# Patient Record
Sex: Female | Born: 1985 | Race: White | Hispanic: No | Marital: Married | Smoking: Former smoker
Health system: Southern US, Community
[De-identification: ages and names within clinical notes are randomized; demographics above are authoritative.]

## PROBLEM LIST (undated history)

## (undated) ENCOUNTER — Inpatient Hospital Stay (HOSPITAL_COMMUNITY): Payer: Self-pay

## (undated) DIAGNOSIS — R51 Headache: Secondary | ICD-10-CM

## (undated) DIAGNOSIS — D649 Anemia, unspecified: Secondary | ICD-10-CM

## (undated) DIAGNOSIS — N809 Endometriosis, unspecified: Secondary | ICD-10-CM

## (undated) DIAGNOSIS — R519 Headache, unspecified: Secondary | ICD-10-CM

## (undated) DIAGNOSIS — R87629 Unspecified abnormal cytological findings in specimens from vagina: Secondary | ICD-10-CM

## (undated) DIAGNOSIS — G932 Benign intracranial hypertension: Secondary | ICD-10-CM

## (undated) DIAGNOSIS — Z8759 Personal history of other complications of pregnancy, childbirth and the puerperium: Secondary | ICD-10-CM

## (undated) DIAGNOSIS — I10 Essential (primary) hypertension: Secondary | ICD-10-CM

## (undated) HISTORY — DX: Headache, unspecified: R51.9

## (undated) HISTORY — DX: Endometriosis, unspecified: N80.9

## (undated) HISTORY — DX: Essential (primary) hypertension: I10

## (undated) HISTORY — PX: COLPOSCOPY: SHX161

## (undated) HISTORY — DX: Unspecified abnormal cytological findings in specimens from vagina: R87.629

## (undated) HISTORY — DX: Headache: R51

---
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
## 2014-08-07 DIAGNOSIS — G932 Benign intracranial hypertension: Secondary | ICD-10-CM

## 2014-08-07 HISTORY — DX: Benign intracranial hypertension: G93.2

## 2014-08-25 ENCOUNTER — Telehealth: Payer: Self-pay | Admitting: Neurology

## 2014-08-25 NOTE — Telephone Encounter (Signed)
Pt moved appt from 02/16 to 08/28/14. Dr. Epifania Gorerr/referring provider was notified.

## 2014-08-27 ENCOUNTER — Ambulatory Visit (INDEPENDENT_AMBULATORY_CARE_PROVIDER_SITE_OTHER): Payer: BLUE CROSS/BLUE SHIELD | Admitting: Neurology

## 2014-08-27 ENCOUNTER — Encounter: Payer: Self-pay | Admitting: Neurology

## 2014-08-27 VITALS — BP 130/74 | HR 78 | Temp 98.6°F | Resp 16 | Ht 67.0 in | Wt 267.2 lb

## 2014-08-27 DIAGNOSIS — H4710 Unspecified papilledema: Secondary | ICD-10-CM

## 2014-08-27 NOTE — Progress Notes (Signed)
NEUROLOGY CONSULTATION NOTE  Lindsey Walker MRN: [id number] DOB: 05/17/1986  Referring provider: Dr. Virginia Rochester Primary care provider: Dr. Lenise Arena  Reason for consult:  Papilledema  HISTORY OF PRESENT ILLNESS: Lindsey Walker is a 29 year old right-handed woman with who presents for evaluation of papilledema.  She is accompanied by her husband and mother who provide some history.  Records reviewed.  About 2 weeks ago, she had a routine eye exam with her optometrist.  Bilateral optic nerve swelling was noted on exam.  She reports that she has been having some vision problems over the past month.  She describes this as mostly seeing spots, which comes and goes.  Sometimes it is difficult to focus.  When she feels agitated, she will see specks in her vision as well.  She also reports a dull bi-frontal headache, described as a dull pressure and about 3-4/10 intensity.  It is constant.  It is not positional and not worse whether she is laying down or standing.  She does wake up with the headache but also notes it can get worse in the evening.  She denies visual obscurations.  She sometimes notes pulsatile tinnitus.  She denies feeling of going to pass out.  She denies numbness and tingling sensation.  She denies eye pain or difficulty ambulating.  She denies neck pain.  PAST MEDICAL HISTORY: Past Medical History  Diagnosis Date  . Headache   . Endometriosis     PAST SURGICAL HISTORY: No past surgical history on file.  MEDICATIONS: No current outpatient prescriptions on file prior to visit.   No current facility-administered medications on file prior to visit.    ALLERGIES: Allergies  Allergen Reactions  . Sulfa Antibiotics     FAMILY HISTORY: Family History  Problem Relation Age of Onset  . Adopted: Yes  . Thyroid disease Mother   . Parkinson's disease Maternal Grandfather   . Thyroid disease Maternal Grandmother   . Hypertension Maternal Grandmother   . Cancer Maternal  Grandmother     bladder    SOCIAL HISTORY: History   Social History  . Marital Status: Unknown    Spouse Name: N/A    Number of Children: N/A  . Years of Education: N/A   Occupational History  . Not on file.   Social History Main Topics  . Smoking status: Former Games developer  . Smokeless tobacco: Former Neurosurgeon  . Alcohol Use: 0.0 oz/week    0 Not specified per week     Comment: socially  . Drug Use: No  . Sexual Activity:    Partners: Male   Other Topics Concern  . Not on file   Social History Narrative  . No narrative on file    REVIEW OF SYSTEMS: Constitutional: No fevers, chills, or sweats, no generalized fatigue, change in appetite Eyes: as above Ear, nose and throat: No hearing loss, ear pain, nasal congestion, sore throat Cardiovascular: No chest pain, palpitations Respiratory:  No shortness of breath at rest or with exertion, wheezes GastrointestinaI: No nausea, vomiting, diarrhea, abdominal pain, fecal incontinence Genitourinary:  No dysuria, urinary retention or frequency Musculoskeletal:  No neck pain, back pain Integumentary: No rash, pruritus, skin lesions Neurological: as above Psychiatric: No depression, insomnia, anxiety Endocrine: No palpitations, fatigue, diaphoresis, mood swings, change in appetite, change in weight, increased thirst Hematologic/Lymphatic:  No anemia, purpura, petechiae. Allergic/Immunologic: no itchy/runny eyes, nasal congestion, recent allergic reactions, rashes  PHYSICAL EXAM: Filed Vitals:   08/27/14 0819  BP: 130/74  Pulse: 78  Temp: 98.6 F (37 C)  Resp: 16   General: No acute distress Head:  Normocephalic/atraumatic Eyes:  fundi unremarkable, without vessel changes, exudates, hemorrhages or papilledema. Neck: supple, no paraspinal tenderness, full range of motion Back: No paraspinal tenderness Heart: regular rate and rhythm Lungs: Clear to auscultation bilaterally. Vascular: No carotid bruits. Neurological  Exam: Mental status: alert and oriented to person, place, and time, recent and remote memory intact, fund of knowledge intact, attention and concentration intact, speech fluent and not dysarthric, language intact. Cranial nerves: CN I: not tested CN II: pupils equal, round and reactive to light, visual fields intact, fundi reveal some blurring of the disc margins bilaterally.  Visual acuity grossly 20/20 bilaterally with corrective lenses. CN III, IV, VI:  full range of motion, no nystagmus, no ptosis CN V: facial sensation intact CN VII: upper and lower face symmetric CN VIII: hearing intact CN IX, X: gag intact, uvula midline CN XI: sternocleidomastoid and trapezius muscles intact CN XII: tongue midline Bulk & Tone: normal, no fasciculations. Motor:  5/5 throughout Sensation:  Temperature and vibration intact Deep Tendon Reflexes:  2+ throughout, toes downgoing Finger to nose testing:  No dysmetria Heel to shin:  No dysmetria Gait:  Normal station and stride.  Able to turn and walk in tandem. Romberg negative.  IMPRESSION: Papilledema.  PLAN: 1.  Will get MRI of brain with and without contrast to rule out structural etiology. 2.  Pending results, may get LP to evaluate CSF, including opening pressure 3.  Follow up after testing.  Thank you for allowing me to take part in the care of this patient.  Shon MilletAdam Jaffe, DO  CC:  Daneen SchickKimberly Orr, OD  Joycelyn RuaStephen Meyers, MD

## 2014-08-27 NOTE — Patient Instructions (Addendum)
We will look into causes of the optic nerve swelling. 1.  First, we will get an MRI of the brain to look for any noticeable cause for these findings.  [facility]  09/11/14 2:45pm 2.  Afterwards, pending results of MRI, we would likely get a lumbar puncture. 3.  I want to see you afterwards.

## 2014-09-11 ENCOUNTER — Ambulatory Visit (HOSPITAL_COMMUNITY)
Admission: RE | Admit: 2014-09-11 | Discharge: 2014-09-11 | Disposition: A | Discharge status: Home / Self Care | Payer: BLUE CROSS/BLUE SHIELD | Source: Ambulatory Visit | Attending: Neurology | Admitting: Neurology

## 2014-09-11 DIAGNOSIS — H4710 Unspecified papilledema: Secondary | ICD-10-CM | POA: Diagnosis not present

## 2014-09-11 DIAGNOSIS — J341 Cyst and mucocele of nose and nasal sinus: Secondary | ICD-10-CM | POA: Insufficient documentation

## 2014-09-11 DIAGNOSIS — R51 Headache: Secondary | ICD-10-CM | POA: Diagnosis present

## 2014-09-11 DIAGNOSIS — H9319 Tinnitus, unspecified ear: Secondary | ICD-10-CM | POA: Insufficient documentation

## 2014-09-11 MED ORDER — GADOBENATE DIMEGLUMINE 529 MG/ML IV SOLN: 20.0000 mL | Freq: Once | INTRAVENOUS | Status: AC | PRN

## 2014-09-14 ENCOUNTER — Other Ambulatory Visit: Payer: Self-pay | Admitting: *Deleted

## 2014-09-14 ENCOUNTER — Telehealth: Payer: Self-pay | Admitting: *Deleted

## 2014-09-14 DIAGNOSIS — H4710 Unspecified papilledema: Secondary | ICD-10-CM

## 2014-09-14 NOTE — Telephone Encounter (Signed)
-----   Message from Cira ServantAdam Robert Jaffe, DO sent at 09/14/2014  6:36 AM EST ----- MRI of the brain does show subtle changes of the optic nerves which correlate with papilledema.  The brain looks okay.  Based on these findings, I would proceed with a lumbar puncture (as we discussed), checking opening pressure, protein and glucose.  She should follow up soon after.  ----- Message -----    From: Rad Results In Interface    Sent: 09/11/2014   5:47 PM      To: Cira ServantAdam Robert Jaffe, DO

## 2014-09-14 NOTE — Telephone Encounter (Signed)
Patient is aware of MRI results  LP will be set up with [place] I have ask her to call me back when she gets a day and time so I can schedule an appt with Dr Everlena CooperJaffe

## 2014-09-14 NOTE — Telephone Encounter (Signed)
patient is aware   MRI of the brain does show subtle changes of the optic nerves which correlate with papilledema.  The brain looks okay.  Based on these findings, I would proceed with a lumbar puncture (as we discussed), checking opening pressure, , [facility] has been notified  And orders [facility]

## 2014-09-14 NOTE — Telephone Encounter (Signed)
Left message for patient to return call to office  regarding her labs

## 2014-09-17 ENCOUNTER — Other Ambulatory Visit: Payer: Self-pay | Admitting: Neurology

## 2014-09-17 ENCOUNTER — Ambulatory Visit
Admission: RE | Admit: 2014-09-17 | Discharge: 2014-09-17 | Disposition: A | Discharge status: Home / Self Care | Payer: BLUE CROSS/BLUE SHIELD | Source: Ambulatory Visit | Attending: Neurology | Admitting: Neurology

## 2014-09-17 DIAGNOSIS — H4710 Unspecified papilledema: Secondary | ICD-10-CM

## 2014-09-17 LAB — GLUCOSE, CSF: Glucose, CSF: 62 mg/dL (ref 43–76)

## 2014-09-17 LAB — PROTEIN, CSF: Total Protein, CSF: 21 mg/dL (ref 15–45)

## 2014-09-17 MED ORDER — DIAZEPAM 5 MG PO TABS
5.0000 mg | ORAL_TABLET | Freq: Once | ORAL | Status: AC
  Administered 2014-09-17: 5 mg via ORAL

## 2014-09-17 NOTE — Discharge Instructions (Signed)

## 2014-09-21 ENCOUNTER — Ambulatory Visit (INDEPENDENT_AMBULATORY_CARE_PROVIDER_SITE_OTHER): Payer: BLUE CROSS/BLUE SHIELD | Admitting: Neurology

## 2014-09-21 ENCOUNTER — Encounter: Payer: Self-pay | Admitting: Neurology

## 2014-09-21 VITALS — BP 120/78 | HR 96 | Resp 16 | Ht 67.0 in | Wt 264.0 lb

## 2014-09-21 DIAGNOSIS — H4710 Unspecified papilledema: Secondary | ICD-10-CM

## 2014-09-21 DIAGNOSIS — G932 Benign intracranial hypertension: Secondary | ICD-10-CM

## 2014-09-21 MED ORDER — TOPIRAMATE 50 MG PO TABS: ORAL_TABLET | ORAL | Status: DC

## 2014-09-21 NOTE — Progress Notes (Signed)
NEUROLOGY FOLLOW UP OFFICE NOTE  Lindsey ShellingJessica A Walker [id number]  HISTORY OF PRESENT ILLNESS: [name] is a 29 year old right-handed woman with who follows up for papilledema.  She is accompanied by her husband and mother who provide some history.  MRI and LP results reviewed.  She is accompanied by her husband who provides some history.  UPDATE: MRI of the brain with and without contrast performed on 09/11/14 showed prominent optic nerve sheathes bilaterally.  She underwent an LP on 09/17/14, which revealed an opening pressure of 32 cm H2O.  Closing pressure was 18 cm H2O.  CSF protein was 21 and glucose was 62.  She has occasional mild bi-frontal headaches and vision is a little blurred.  HISTORY: Recently, she had a routine eye exam with her optometrist.  Bilateral optic nerve swelling was noted on exam.  She reports that she has been having some vision problems over the past month.  She describes this as mostly seeing spots, which comes and goes.  Sometimes it is difficult to focus.  When she feels agitated, she will see specks in her vision as well.  She also reports a dull bi-frontal headache, described as a dull pressure and about 3-4/10 intensity.  It is constant.  It is not positional and not worse whether she is laying down or standing.  She does wake up with the headache but also notes it can get worse in the evening.  She denies visual obscurations.  She sometimes notes pulsatile tinnitus.  She denies feeling of going to pass out.  She denies numbness and tingling sensation.  She denies eye pain or difficulty ambulating.  She denies neck pain.  PAST MEDICAL HISTORY: Past Medical History  Diagnosis Date  . Headache   . Endometriosis     MEDICATIONS: Current Outpatient Prescriptions on File Prior to Visit  Medication Sig Dispense Refill  . ibuprofen (ADVIL,MOTRIN) 200 MG tablet Take 200 mg by mouth every 6 (six) hours as needed for headache.     No current  facility-administered medications on file prior to visit.    ALLERGIES: Allergies  Allergen Reactions  . Sulfa Antibiotics     FAMILY HISTORY: Family History  Problem Relation Age of Onset  . Adopted: Yes  . Thyroid disease Mother   . Parkinson's disease Maternal Grandfather   . Thyroid disease Maternal Grandmother   . Hypertension Maternal Grandmother   . Cancer Maternal Grandmother     bladder    SOCIAL HISTORY: History   Social History  . Marital Status: Single    Spouse Name: N/A  . Number of Children: N/A  . Years of Education: N/A   Occupational History  . Not on file.   Social History Main Topics  . Smoking status: Former Games developermoker  . Smokeless tobacco: Former NeurosurgeonUser  . Alcohol Use: 0.0 oz/week    0 Standard drinks or equivalent per week     Comment: socially  . Drug Use: No  . Sexual Activity:    Partners: Male   Other Topics Concern  . Not on file   Social History Narrative    REVIEW OF SYSTEMS: Constitutional: No fevers, chills, or sweats, no generalized fatigue, change in appetite Eyes: No visual changes, double vision, eye pain Ear, nose and throat: No hearing loss, ear pain, nasal congestion, sore throat Cardiovascular: No chest pain, palpitations Respiratory:  No shortness of breath at rest or with exertion, wheezes GastrointestinaI: No nausea, vomiting, diarrhea, abdominal pain, fecal incontinence Genitourinary:  No dysuria, urinary retention or frequency Musculoskeletal:  No neck pain, back pain Integumentary: No rash, pruritus, skin lesions Neurological: as above Psychiatric: No depression, insomnia, anxiety Endocrine: No palpitations, fatigue, diaphoresis, mood swings, change in appetite, change in weight, increased thirst Hematologic/Lymphatic:  No anemia, purpura, petechiae. Allergic/Immunologic: no itchy/runny eyes, nasal congestion, recent allergic reactions, rashes  PHYSICAL EXAM: Filed Vitals:   09/21/14 1212  BP: 120/78  Pulse:  96  Resp: 16   General: No acute distress Head:  Normocephalic/atraumatic Eyes:  Fundi not able to be visualized on inspection Neck: supple, no paraspinal tenderness, full range of motion Heart:  Regular rate and rhythm Lungs:  Clear to auscultation bilaterally Back: No paraspinal tenderness Neurological Exam: alert and oriented to person, place, and time. Attention span and concentration intact, recent and remote memory intact, fund of knowledge intact.  Speech fluent and not dysarthric, language intact.  CN II-XII intact. Bulk and tone normal, muscle strength 5/5 throughout.  Sensation to light touch, temperature and vibration intact.  Deep tendon reflexes 2+ throughout.  Finger to nose testing intact.  Gait normal.  IMPRESSION: Idiopathic intracranial hypertension Morbid obesity  PLAN: 1.  I would ideally like to start acetazolamide.  Although it is not an absolute contraindication, I am hesitant about starting this medication due to her sulfa allergy, especially since it was anaphylaxis.  Instead, we will try topiramate and titrate up to goal of  twice daily.  It if does not appear to be effective, we can discuss switching to acetazolamide.  In meantime, I recommended starting folic acid  daily. 2.  I would like her to have a formal ophthalmologic evaluation again in 3 months with follow up with me soon afterwards. 3.  We will also check MRV of head to rule out venous sinus thrombosis. 4.  Weight loss  Shon Millet, DO  CC: Joycelyn Rua, MD

## 2014-09-21 NOTE — Patient Instructions (Addendum)
1.  We will start topiramate (Topamax) 50mg  tablets.  We will increase the dose as follows to goal of 100mg  twice daily:      Morning Evening Week 1:   0.5 tab  0.5 tab Week 2:    1 tab  1 tab Week 3:   1.5 tabs 1.5 tabs Week 4 and thereafter 2 tabs  2 tabs.  Possible side effects include: impaired thinking, sedation, paresthesias (numbness and tingling) and weight loss.  It may cause dehydration and there is a small risk for kidney stones, so make sure to stay hydrated with water during the day.  There is also a very small risk for glaucoma, so if you notice any change in your vision while taking this medication, see an ophthalmologist.  There is also a very small risk of possible suicidal ideation, as it the case with all antiepileptic medications.  Pregnancy Guidelines 1. If any medication changes are to be made, they should be completed several months before conception. Lamictal (lamotrigine) and Trileptal (oxcarbazepine) levels, in particular, need to carefully monitored (at least once a month) during pregnancy as drug levels for these two medications tend to decrease by at least 30% during pregnancy. 2. The risk of fetal malformation is increased in women taking seizure medications: However, the risk is lower in Topamax, when compared to older antiepileptic medications.  The risk is approximately 3% compared to 1-2% in the general population. 3. Women with epilepsy planning a pregnancy should take 5 mg/day of folic acid in the preconception period and throughout pregnancy. 4. Vitamin K (20 mg/day) should be used in the last month of pregnancy in women on enzyme-inducing seizure medications (such as Topamax).  Infants should receive 1 mg of vitamin K intramuscularly at birth, to prevent hemorrhagic disease of the newborn. 5. Overbreathing (hyperventilation), sleep deprivation, pain, and emotional stress increase the risk of seizures during labor.  Consider epidural anesthesia early in the  labor. 6. All currently available seizure medications can be taken while breast feeding.  2.  We will refer you to an ophthalmologist for evaluation of papilledema.  I would like this scheduled in 2 to 3 months.  Follow up with me in 3 months following eye exam.  3.  We will check MRV of head.   YOU HAVE BEEN SCHEDULED AT [facility]  Wed. 12/09/2014 @ 9:30 am with Dr. Sinda DuBradley Bowen  [address] [place], [place] [id number] [phone]

## 2014-09-25 ENCOUNTER — Ambulatory Visit: Payer: Self-pay | Admitting: Neurology

## 2014-10-01 ENCOUNTER — Ambulatory Visit (HOSPITAL_COMMUNITY)
Admission: RE | Admit: 2014-10-01 | Discharge: 2014-10-01 | Disposition: A | Discharge status: Home / Self Care | Payer: BLUE CROSS/BLUE SHIELD | Source: Ambulatory Visit | Attending: Neurology | Admitting: Neurology

## 2014-10-01 DIAGNOSIS — G932 Benign intracranial hypertension: Secondary | ICD-10-CM | POA: Insufficient documentation

## 2014-10-01 DIAGNOSIS — R51 Headache: Secondary | ICD-10-CM | POA: Diagnosis present

## 2014-10-01 DIAGNOSIS — H539 Unspecified visual disturbance: Secondary | ICD-10-CM | POA: Diagnosis not present

## 2014-10-02 ENCOUNTER — Telehealth: Payer: Self-pay | Admitting: *Deleted

## 2014-10-02 NOTE — Telephone Encounter (Signed)
-----   Message from Cira ServantAdam Robert Jaffe, DO sent at 10/01/2014  9:55 PM EST ----- MRV of the head is unremarkable ----- Message -----    From: Rad Results In Interface    Sent: 10/01/2014   7:17 PM      To: Cira ServantAdam Robert Jaffe, DO

## 2014-10-02 NOTE — Telephone Encounter (Signed)
Patient is aware of normal MRV

## 2014-10-17 ENCOUNTER — Other Ambulatory Visit: Payer: Self-pay | Admitting: Neurology

## 2014-10-27 ENCOUNTER — Telehealth: Payer: Self-pay | Admitting: Neurology

## 2014-10-27 NOTE — Telephone Encounter (Signed)
Pt called wanting to speak to a nurse regarding her script for TOPAMAX. Pt is experiencing "white flashes" on her vision. She states that it started last week. Please call her back [phone] and/or [phone]

## 2014-10-27 NOTE — Telephone Encounter (Signed)
Patient states she has started having white flashing in the eyes  Like a camera  About 2 times a day . She is at her full strength of  TOPAMAX 200mg  daily and has been for 2 weeks this has been going on for a few days. I have advised her to [facility] and see if her appt can be moved up sooner it is in May  She is asking if this could be a reaction to the TOPAMAX ? Please advies

## 2014-10-27 NOTE — Telephone Encounter (Signed)
Probably not, but she I agree that she have another formal eye exam.

## 2014-10-27 NOTE — Telephone Encounter (Signed)
Patient will try to get appt moved up for eye exam

## 2014-11-03 ENCOUNTER — Telehealth: Payer: Self-pay | Admitting: Neurology

## 2014-11-03 ENCOUNTER — Other Ambulatory Visit: Payer: Self-pay | Admitting: *Deleted

## 2014-11-03 ENCOUNTER — Other Ambulatory Visit: Payer: Self-pay | Admitting: Neurology

## 2014-11-03 DIAGNOSIS — G932 Benign intracranial hypertension: Secondary | ICD-10-CM

## 2014-11-03 NOTE — Telephone Encounter (Signed)
I spoke with patient she states she has had a headache now for going on 5 days and the Topamax 200mg  daily  is not helping . She did have her formal eye testing done yesterday and states it did not go well they found her to have swelling of the optic nerve . I have requested this office note and testing be sent to us asap . She is asking for FMLA maybe a bit of time off would help she says. Please advise

## 2014-11-03 NOTE — Telephone Encounter (Signed)
Pt states that she has had a headache for the last five days. She went to the eye Dr  yesterday and states that appt did not go well please call patient at [phone] and if you dont get her on that number call [phone]

## 2014-11-03 NOTE — Telephone Encounter (Signed)
I would start Lasix.  First, we need to check a BMP.  Then after reviewing the K level, would start Lasix 20mg  daily along with potassium chloride 20 mEq daily.  I would check another BMP in 2 weeks after that.    If she has had any improvement in headaches on the topamax at all, then I would continue it for now.  If there has been absolutely no improvement in headache overall, then she can take 100mg  daily for a week and then stop

## 2014-11-03 NOTE — Telephone Encounter (Signed)
Patient states that headaches have gotten better until about 5 days ago . She will stay on Topamax 200 mg  BMP slip was taken to front office she will pick up have labs drawn fasting and we will let her know when to start the Lasix .

## 2014-11-04 LAB — BASIC METABOLIC PANEL
BUN: 14 mg/dL (ref 6–23)
CALCIUM: 9.2 mg/dL (ref 8.4–10.5)
CHLORIDE: 109 meq/L (ref 96–112)
CO2: 21 mEq/L (ref 19–32)
CREATININE: 0.68 mg/dL (ref 0.50–1.10)
Glucose, Bld: 83 mg/dL (ref 70–99)
Potassium: 4.5 mEq/L (ref 3.5–5.3)
SODIUM: 140 meq/L (ref 135–145)

## 2014-11-05 ENCOUNTER — Other Ambulatory Visit: Payer: Self-pay | Admitting: *Deleted

## 2014-11-05 ENCOUNTER — Telehealth: Payer: Self-pay | Admitting: *Deleted

## 2014-11-05 DIAGNOSIS — G932 Benign intracranial hypertension: Secondary | ICD-10-CM

## 2014-11-05 MED ORDER — FUROSEMIDE 20 MG PO TABS: 20.0000 mg | ORAL_TABLET | Freq: Every day | ORAL | Status: DC

## 2014-11-05 MED ORDER — POTASSIUM CHLORIDE ER 10 MEQ PO TBCR: 20.0000 meq | EXTENDED_RELEASE_TABLET | Freq: Every day | ORAL | Status: DC

## 2014-11-05 NOTE — Telephone Encounter (Signed)
-----   Message from Drema DallasAdam R Jaffe, DO sent at 11/05/2014  6:53 AM EDT ----- Potassium looks okay.  Start Lasix 20mg  daily along with potassium chloride 20 mEq daily. I would check another BMP in 2 weeks. ----- Message -----    From: Lab in [facility] Interface    Sent: 11/04/2014  10:28 PM      To: Drema DallasAdam R Jaffe, DO

## 2014-11-05 NOTE — Telephone Encounter (Signed)
Patient is aware that Potassium looks okay. Start Lasix 20mg  daily along with potassium chloride 20 mEq daily. She  check another BMP in 2 weeks.I will mail lab slip to her..Marland Kitchen

## 2014-11-12 ENCOUNTER — Encounter: Payer: Self-pay | Admitting: Neurology

## 2014-11-12 ENCOUNTER — Ambulatory Visit (INDEPENDENT_AMBULATORY_CARE_PROVIDER_SITE_OTHER): Payer: BLUE CROSS/BLUE SHIELD | Admitting: Neurology

## 2014-11-12 VITALS — BP 118/68 | HR 88 | Resp 20 | Ht 67.0 in | Wt 249.5 lb

## 2014-11-12 DIAGNOSIS — Z882 Allergy status to sulfonamides status: Secondary | ICD-10-CM | POA: Diagnosis not present

## 2014-11-12 DIAGNOSIS — G932 Benign intracranial hypertension: Secondary | ICD-10-CM

## 2014-11-12 MED ORDER — TOPIRAMATE 100 MG PO TABS: 200.0000 mg | ORAL_TABLET | Freq: Every day | ORAL | Status: DC

## 2014-11-12 MED ORDER — FUROSEMIDE 20 MG PO TABS: 20.0000 mg | ORAL_TABLET | Freq: Two times a day (BID) | ORAL | Status: DC

## 2014-11-12 NOTE — Patient Instructions (Signed)
1.  Increase Lasix to 20mg  twice daily.  Will repeat BMP lab in one week. 2.  Take Topiramate 200mg  at bedtime 3.  Refer to allergist to assess sulfa allergy 4.  Send FMLA papers 5.  Follow up with the eye doctor later this month and then with me in one month.

## 2014-11-12 NOTE — Progress Notes (Signed)
NEUROLOGY FOLLOW UP OFFICE NOTE  LATESHA CHESNEY [id number]  HISTORY OF PRESENT ILLNESS: Lindsey Walker is a 29 year old right-handed woman with who follows up for papilledema.  She is accompanied by her husband and mother who provide some history.  Records, labs and MRV of head reviewed.  She is accompanied by her husband who provides some history.  UPDATE: MRV of head was negative for sinus thrombosis.  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  Because she has a sulfa allergy, she was started on topiramate, which was titrated to  twice daily.  It helped the headaches overall, but she then developed worsening headache and vision problems.  Last week, she had a follow up eye exam, which showed slight worsening of papilledema.  K was checked, which was 4.5.  She was started on Lasix  daily, in addition to topiramate.  She says that the Lasix is causing side effects.  It makes it difficult for her to concentrate.  She also has had headache for 15 days.  It is difficult for her to work.  She now says that her mother believes her allergic reaction to sulfa as a young child caused a severe rash and not anaphylaxis.  She has been losing weight.  She has lost 15 lbs since last visit in February.  HISTORY: Earlier this year, she had a routine eye exam with her optometrist.  Bilateral optic nerve swelling was noted on exam.  She reports that she has been having some vision problems over the past month.  She describes this as mostly seeing spots, which comes and goes.  Sometimes it is difficult to focus.  When she feels agitated, she will see specks in her vision as well.  She also reports a dull bi-frontal headache, described as a dull pressure and about 3-4/10 intensity.  It is constant.  It is not positional and not worse whether she is laying down or standing.  She does wake up with the headache but  also notes it can get worse in the evening.  She denies visual obscurations.  She sometimes notes pulsatile tinnitus.  She denies feeling of going to pass out.  She denies numbness and tingling sensation.  She denies eye pain or difficulty ambulating.  She denies neck pain.    MRI of the brain with and without contrast performed on 09/11/14 showed prominent optic nerve sheathes bilaterally.  She underwent an LP on 09/17/14, which revealed an opening pressure of 32 cm H2O.  Closing pressure was 18 cm H2O.  CSF protein was 21 and glucose was 62.  She has a sulfa allergy with anaphylactic reaction, so she cannot take acetazolamide.   She works as a Data processing manager for [employer].  PAST MEDICAL HISTORY: Past Medical History  Diagnosis Date  . Headache   . Endometriosis     MEDICATIONS: Current Outpatient Prescriptions on File Prior to Visit  Medication Sig Dispense Refill  . ibuprofen (ADVIL,MOTRIN) 200 MG tablet Take 200 mg by mouth every 6 (six) hours as needed for headache.    . potassium chloride (K-DUR) 10 MEQ tablet Take 2 tablets (20 mEq total) by mouth daily. 60 tablet 1   No current facility-administered medications on file prior to visit.    ALLERGIES: Allergies  Allergen Reactions  . Sulfa Antibiotics     FAMILY HISTORY: Family History  Problem Relation Age of Onset  .  Adopted: Yes  . Thyroid disease Mother   . Parkinson's disease Maternal Grandfather   . Thyroid disease Maternal Grandmother   . Hypertension Maternal Grandmother   . Cancer Maternal Grandmother     bladder    SOCIAL HISTORY: History   Social History  . Marital Status: Single    Spouse Name: N/A  . Number of Children: N/A  . Years of Education: N/A   Occupational History  . Not on file.   Social History Main Topics  . Smoking status: Former Games developer  . Smokeless tobacco: Never Used  . Alcohol Use: 0.0 oz/week    0 Standard drinks or equivalent per week     Comment: socially  . Drug Use: No    . Sexual Activity:    Partners: Male   Other Topics Concern  . Not on file   Social History Narrative    REVIEW OF SYSTEMS: Constitutional: No fevers, chills, or sweats, no generalized fatigue, change in appetite Eyes: No visual changes, double vision, eye pain Ear, nose and throat: No hearing loss, ear pain, nasal congestion, sore throat Cardiovascular: No chest pain, palpitations Respiratory:  No shortness of breath at rest or with exertion, wheezes GastrointestinaI: No nausea, vomiting, diarrhea, abdominal pain, fecal incontinence Genitourinary:  No dysuria, urinary retention or frequency Musculoskeletal:  No neck pain, back pain Integumentary: No rash, pruritus, skin lesions Neurological: as above Psychiatric: No depression, insomnia, anxiety Endocrine: No palpitations, fatigue, diaphoresis, mood swings, change in appetite, change in weight, increased thirst Hematologic/Lymphatic:  No anemia, purpura, petechiae. Allergic/Immunologic: no itchy/runny eyes, nasal congestion, recent allergic reactions, rashes  PHYSICAL EXAM: Filed Vitals:   11/12/14 0749  BP: 118/68  Pulse: 88  Resp: 20   General: No acute distress, but in tears when discussing her discomfort Head:  Normocephalic/atraumatic Eyes:  Unable to visualize fundi on inspection Neck: supple, no paraspinal tenderness, full range of motion Heart:  Regular rate and rhythm Lungs:  Clear to auscultation bilaterally Back: No paraspinal tenderness Neurological Exam: alert and oriented to person, place, and time. Attention span and concentration intact, recent and remote memory intact, fund of knowledge intact.  Speech fluent and not dysarthric, language intact.  CN II-XII intact.  Bulk and tone normal, muscle strength 5/5 throughout.  Sensation to light touch, temperature and vibration intact.  Deep tendon reflexes 2+ throughout, toes downgoing.  Finger to nose and heel to shin testing intact.  Gait normal, Romberg  negative.  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  Due to the confusion and lack of clarity regarding her reaction to sulfa, we will refer her to an allergist in case acetazolamide may still be a possibility.  I don't mind starting it and monitoring for a rash, but I really don't want to start it if there is any chance that she had an anaphylactic reaction. 2.  We will increase Lasix to  twice daily.  Will recheck BMP (potassium) in one week. 3.  Continue topiramate, but may take  at bedtime (instead of  twice daily) 4.  Continue weight loss and low sodium diet. 5.  Asked that she send FMLA forms. 6.  She will need to have her eyes examined every month.  She is scheduled to see the ophthalmologist at the end of the month.  I will see her back in 4 weeks. 7.  If acetazolamide is not an option and she cannot remain on Lasix, we discussed other options such as  optic nerve sheathe fenestration or therapeutic tap (if the headaches particularly get severe)  Shon MilletAdam Mertis Mosher, DO  CC:  Joycelyn RuaStephen Meyers, MD

## 2014-11-13 ENCOUNTER — Encounter: Payer: Self-pay | Admitting: Neurology

## 2014-11-17 ENCOUNTER — Telehealth: Payer: Self-pay | Admitting: *Deleted

## 2014-11-17 ENCOUNTER — Telehealth: Payer: Self-pay | Admitting: Neurology

## 2014-11-17 ENCOUNTER — Other Ambulatory Visit: Payer: Self-pay | Admitting: *Deleted

## 2014-11-17 DIAGNOSIS — G932 Benign intracranial hypertension: Secondary | ICD-10-CM

## 2014-11-17 NOTE — Telephone Encounter (Signed)
Pt wants to know if we got the FMLA forms from her job last week please call [phone]

## 2014-11-17 NOTE — Telephone Encounter (Signed)
She can take the Topamax in the morning.  As for the Lasix, there are no other options as far as medication.  If it is really severe, we can schedule for a therapeutic high-volume spinal tap.

## 2014-11-17 NOTE — Telephone Encounter (Signed)
Just a high-volume tap with opening pressure.

## 2014-11-17 NOTE — Telephone Encounter (Signed)
I spoke with patient per Dr Everlena CooperJaffe it is ok to do Topamax in am and HS  He is recommending High Volume LP  She has agreed to this but will have to wait for 2 weeks as her spouse will be out of town. Please advise on what you what in the LP

## 2014-11-17 NOTE — Telephone Encounter (Signed)
Patient is aware that FMLA forms are have not arrived

## 2014-11-17 NOTE — Telephone Encounter (Signed)
Patient called asking can she take Topamax in am ? She states she noticed since she is taking Lasix her headaches are acting crazy. She went to gym and had one so bad she almost had to have someone come get her . Please advise

## 2014-11-18 ENCOUNTER — Other Ambulatory Visit: Payer: Self-pay | Admitting: Neurology

## 2014-11-19 DIAGNOSIS — Z0279 Encounter for issue of other medical certificate: Secondary | ICD-10-CM

## 2014-11-20 LAB — BASIC METABOLIC PANEL
BUN: 16 mg/dL (ref 6–23)
CO2: 25 mEq/L (ref 19–32)
CREATININE: 0.76 mg/dL (ref 0.50–1.10)
Calcium: 9.7 mg/dL (ref 8.4–10.5)
Chloride: 106 mEq/L (ref 96–112)
GLUCOSE: 88 mg/dL (ref 70–99)
POTASSIUM: 3.9 meq/L (ref 3.5–5.3)
Sodium: 141 mEq/L (ref 135–145)

## 2014-11-24 ENCOUNTER — Telehealth: Payer: Self-pay | Admitting: *Deleted

## 2014-11-24 ENCOUNTER — Telehealth: Payer: Self-pay | Admitting: Neurology

## 2014-11-24 NOTE — Telephone Encounter (Signed)
Pt wants to talk to susie about FMLA paper please call [phone]

## 2014-11-24 NOTE — Telephone Encounter (Signed)
Patient returning your call please call back first thing in the Am

## 2014-11-24 NOTE — Telephone Encounter (Signed)
I called and left a message for patient to return call ,

## 2014-11-25 ENCOUNTER — Telehealth: Payer: Self-pay | Admitting: *Deleted

## 2014-11-25 NOTE — Telephone Encounter (Signed)
Patient is aware that [facility]  After a 15 min discussion they have agreed to approve her FMLA for now .

## 2014-11-25 NOTE — Telephone Encounter (Signed)
I spoke with patient I explained to her there are just some areas on the FMLA that Dr Everlena CooperJaffe has already filled out to the best of his knowledge and cannot change . I will call the [name] today and talke to Ecorsearmen to try to help this move forward .

## 2014-12-02 ENCOUNTER — Ambulatory Visit
Admission: RE | Admit: 2014-12-02 | Discharge: 2014-12-02 | Disposition: A | Discharge status: Home / Self Care | Payer: BLUE CROSS/BLUE SHIELD | Source: Ambulatory Visit | Attending: Neurology | Admitting: Neurology

## 2014-12-02 DIAGNOSIS — G932 Benign intracranial hypertension: Secondary | ICD-10-CM

## 2014-12-02 MED ORDER — DIAZEPAM 5 MG PO TABS
5.0000 mg | ORAL_TABLET | Freq: Once | ORAL | Status: AC
  Administered 2014-12-02: 5 mg via ORAL

## 2014-12-02 NOTE — Discharge Instructions (Signed)

## 2014-12-04 ENCOUNTER — Other Ambulatory Visit: Payer: Self-pay | Admitting: *Deleted

## 2014-12-04 ENCOUNTER — Telehealth: Payer: Self-pay | Admitting: *Deleted

## 2014-12-04 ENCOUNTER — Other Ambulatory Visit: Payer: Self-pay | Admitting: Neurology

## 2014-12-04 DIAGNOSIS — G971 Other reaction to spinal and lumbar puncture: Secondary | ICD-10-CM

## 2014-12-04 DIAGNOSIS — G932 Benign intracranial hypertension: Secondary | ICD-10-CM

## 2014-12-04 NOTE — Telephone Encounter (Signed)
Patient is aware of Blood patch being set up for her at [place] I have left message with Duwayne HeckDanielle to arrange appt  For her and to call  Patient

## 2014-12-07 ENCOUNTER — Ambulatory Visit
Admission: RE | Admit: 2014-12-07 | Discharge: 2014-12-07 | Disposition: A | Discharge status: Home / Self Care | Payer: BLUE CROSS/BLUE SHIELD | Source: Ambulatory Visit | Attending: Neurology | Admitting: Neurology

## 2014-12-07 DIAGNOSIS — G971 Other reaction to spinal and lumbar puncture: Secondary | ICD-10-CM

## 2014-12-07 MED ORDER — IOHEXOL 180 MG/ML  SOLN
1.0000 mL | Freq: Once | INTRAMUSCULAR | Status: AC | PRN
  Administered 2014-12-07: 1 mL via EPIDURAL

## 2014-12-07 MED ORDER — DIAZEPAM 5 MG PO TABS
5.0000 mg | ORAL_TABLET | Freq: Once | ORAL | Status: AC
  Administered 2014-12-07: 5 mg via ORAL

## 2014-12-07 NOTE — Discharge Instructions (Signed)
Blood Patch Discharge Instructions  1. Go home and rest quietly for the next 24 hours.  It is important to lie flat for the next 24 hours.  Get up only to go to the restroom.  You may lie in the bed or on a couch on your back, your stomach, your left side or your right side.  You may have one pillow under your head.  You may have pillows between your knees while you are on your side or under your knees while you are on your back.  2. DO NOT drive today.  Recline the seat as far back as it will go, while still wearing your seat belt, on the way home.  3. You may get up to go to the bathroom as needed.  You may sit up for 10 minutes to eat.  You may resume your normal diet and medications unless otherwise indicated.  Drink lots of extra fluids today and tomorrow.  4. The incidence of headache, nausea, or vomiting is about 5% (one in 20 patients).  If you develop a headache, lie flat and drink plenty of fluids until the headache goes away.  Caffeinated beverages may be helpful.  If you develop severe nausea and vomiting or a headache that does not go away with flat bed rest, call the physician who sent you here.   5. You may resume normal activities after your 24 hours of bed rest is over; however, do not exert yourself strongly or do any heavy lifting tomorrow.  6. Call your physician for a follow-up appointment.   7. If you have any questions  after you arrive home, please call [phone].  Discharge instructions have been explained to the patient.  The patient, or the person responsible for the patient, fully understands these instructions.

## 2014-12-14 ENCOUNTER — Other Ambulatory Visit: Payer: Self-pay | Admitting: Neurology

## 2014-12-21 ENCOUNTER — Encounter: Payer: Self-pay | Admitting: Neurology

## 2014-12-21 ENCOUNTER — Ambulatory Visit (INDEPENDENT_AMBULATORY_CARE_PROVIDER_SITE_OTHER): Payer: BLUE CROSS/BLUE SHIELD | Admitting: Neurology

## 2014-12-21 VITALS — BP 104/64 | HR 84 | Ht 67.0 in | Wt 237.0 lb

## 2014-12-21 DIAGNOSIS — L559 Sunburn, unspecified: Secondary | ICD-10-CM

## 2014-12-21 DIAGNOSIS — Z79899 Other long term (current) drug therapy: Secondary | ICD-10-CM

## 2014-12-21 DIAGNOSIS — G932 Benign intracranial hypertension: Secondary | ICD-10-CM

## 2014-12-21 NOTE — Patient Instructions (Addendum)
I'm glad you are doing better. 1.  Continue the topamax 200mg  daily and Lasix (furosemide) 20mg  twice daily 2.  Recheck BMP (potassium level) 3.  Make sure the allergist's note and eye doctor's not is sent to me 4.  Continue weight loss and low sodium diet 5.  Follow up in 4 weeks.

## 2014-12-21 NOTE — Progress Notes (Addendum)
NEUROLOGY FOLLOW UP OFFICE NOTE  Lindsey ShellingJessica A Walker [id number]  HISTORY OF PRESENT ILLNESS: Lindsey DamesJessica Walker is a 29 year old right-handed woman with who follows up for papilledema.  She is accompanied by her husband and mother who provide some history.  Records from LP and labs reviewed.  UPDATE: She is taking topiramate 200mg  and Lasix 20mg  twice daily.  K from 11/19/14 was 3.9.  Due to persist headaches, she underwent a high-volume therapeutic lumbar puncture on 12/02/14.  It revealed a significantly improved opening pressure of 23 cm water.  Approximately 24 ml of CSF was removed.  She developed a post-LP headache afterwards and she underwent a blood patch.  Headaches have been much improved.  She hasn't had a headache in 3 days.  When she has a headache, it is usually now dull.  She has successfully been losing weight.  She has an upcoming follow up appointment with the ophthalmologist.  She has a consultation with the allergist in 2 days to assess sulfa allergy.  She reports that she has easily been sunburned and was wondering if furosemide can cause that.  HISTORY: Earlier this year, she had a routine eye exam with her optometrist.  Bilateral optic nerve swelling was noted on exam.  She reports that she has been having some vision problems over the past month.  She describes this as mostly seeing spots, which comes and goes.  Sometimes it is difficult to focus.  When she feels agitated, she will see specks in her vision as well.  She also reports a dull bi-frontal headache, described as a dull pressure and about 3-4/10 intensity.  It is constant.  It is not positional and not worse whether she is laying down or standing.  She does wake up with the headache but also notes it can get worse in the evening.  She denies visual obscurations.  She sometimes notes pulsatile tinnitus.  She denies feeling of going to pass out.  She denies numbness and tingling sensation.  She denies eye pain or  difficulty ambulating.  She denies neck pain.    MRI of the brain with and without contrast performed on 09/11/14 showed prominent optic nerve sheathes bilaterally.  MRV of head was negative for sinus thrombosis.  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    She underwent an LP on 09/17/14, which revealed an opening pressure of 32 cm H2O.  Closing pressure was 18 cm H2O.  CSF protein was 21 and glucose was 62.  Because she has a sulfa allergy, she was started on topiramate, which was titrated to 200mg .  It helped the headaches overall, but she then developed worsening headache and vision problems.  Follow up eye exam showed slight worsening of papilledema.  She was started on Lasix 20mg  daily, in addition to topiramate.  She has a sulfa allergy with anaphylactic reaction, so she cannot take acetazolamide.   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  PAST MEDICAL HISTORY: Past Medical History  Diagnosis Date  . Headache   . Endometriosis     MEDICATIONS: Current Outpatient Prescriptions on File Prior to Visit  Medication Sig Dispense Refill  . furosemide (LASIX) 20 MG tablet TAKE 1 TABLET (20 MG TOTAL) BY MOUTH 2 (TWO) TIMES DAILY. 60 tablet 0  . ibuprofen (ADVIL,MOTRIN) 200 MG tablet Take 200 mg by mouth every 6 (six) hours as needed for headache.    .Marland Kitchen  KLOR-CON M10 10 MEQ tablet     . topiramate (TOPAMAX) 100 MG tablet Take 2 tablets (200 mg total) by mouth at bedtime. (Patient taking differently: Take 100 mg by mouth 2 (two) times daily. ) 60 tablet 5   No current facility-administered medications on file prior to visit.    ALLERGIES: Allergies  Allergen Reactions  . Sulfa Antibiotics     FAMILY HISTORY: Family History  Problem Relation Age of Onset  . Adopted: Yes  . Thyroid disease Mother   . Parkinson's disease Maternal Grandfather   . Thyroid disease Maternal Grandmother   .  Hypertension Maternal Grandmother   . Cancer Maternal Grandmother     bladder    SOCIAL HISTORY: History   Social History  . Marital Status: Single    Spouse Name: N/A  . Number of Children: N/A  . Years of Education: N/A   Occupational History  . Not on file.   Social History Main Topics  . Smoking status: Former Games developermoker  . Smokeless tobacco: Never Used  . Alcohol Use: 0.0 oz/week    0 Standard drinks or equivalent per week     Comment: socially  . Drug Use: No  . Sexual Activity:    Partners: Male   Other Topics Concern  . Not on file   Social History Narrative    REVIEW OF SYSTEMS: Constitutional: No fevers, chills, or sweats, no generalized fatigue, change in appetite Eyes: No visual changes, double vision, eye pain Ear, nose and throat: No hearing loss, ear pain, nasal congestion, sore throat Cardiovascular: No chest pain, palpitations Respiratory:  No shortness of breath at rest or with exertion, wheezes GastrointestinaI: No nausea, vomiting, diarrhea, abdominal pain, fecal incontinence Genitourinary:  No dysuria, urinary retention or frequency Musculoskeletal:  No neck pain, back pain Integumentary: No rash, pruritus, skin lesions Neurological: as above Psychiatric: No depression, insomnia, anxiety Endocrine: No palpitations, fatigue, diaphoresis, mood swings, change in appetite, change in weight, increased thirst Hematologic/Lymphatic:  No anemia, purpura, petechiae. Allergic/Immunologic: no itchy/runny eyes, nasal congestion, recent allergic reactions, rashes  PHYSICAL EXAM: Filed Vitals:   12/21/14 0855  BP: 104/64  Pulse: 84   General: No acute distress Head:  Normocephalic/atraumatic Eyes:  Fundi not visualized on inspection Neck: supple, no paraspinal tenderness, full range of motion Heart:  Regular rate and rhythm Lungs:  Clear to auscultation bilaterally Back: No paraspinal tenderness Neurological Exam: alert and oriented to person, place,  and time. Attention span and concentration intact, recent and remote memory intact, fund of knowledge intact.  Speech fluent and not dysarthric, language intact.  CN II-XII intact. Bulk and tone normal, muscle strength 5/5 throughout.  Sensation to light touch, temperature and vibration intact.  Deep tendon reflexes 2+ throughout, toes downgoing.  Finger to nose and heel to shin testing intact.  Gait normal, Romberg negative.  IMPRESSION: Idiopathic intracranial hypertension, improved. Sunburn.  Furosemide can cause photosensitivity, but I am not sure how easily it can cause sunburn.    PLAN: 1.  Continue topiramate 200mg  daily and furosemide 20mg  twice daily. 2.  Await re-evaluation from ophthalmologist 3.  Follow up allergist's evaluation 4.  Continue weight loss 5.  Low sodium diet. 6.  Wear sun block 7.  Recheck K level 8.  Follow up in 4 weeks.  15 minutes spent with patient, over 50% spent discussing management and how she has been doing.  Shon MilletAdam Jaffe, DO  CC:  Joycelyn RuaStephen Meyers

## 2014-12-23 ENCOUNTER — Telehealth: Payer: Self-pay | Admitting: *Deleted

## 2014-12-23 NOTE — Telephone Encounter (Signed)
Patient would like for you to give call in reference  to her allergy appointment this AM Call back number [phone] or [phone]

## 2014-12-23 NOTE — Telephone Encounter (Signed)
Patient  Went to Allergy Dr today however he made her aware that he did not do testing for types of medications however I spoke with the nurse adn she assured me that they did do testing  for Sulfa allergies at this point the patient feels she is ok not to have it done

## 2014-12-25 ENCOUNTER — Telehealth: Payer: Self-pay | Admitting: Family Medicine

## 2014-12-25 ENCOUNTER — Other Ambulatory Visit: Payer: Self-pay | Admitting: Neurology

## 2014-12-25 LAB — BASIC METABOLIC PANEL
BUN: 11 mg/dL (ref 6–23)
CALCIUM: 9.5 mg/dL (ref 8.4–10.5)
CO2: 22 mEq/L (ref 19–32)
Chloride: 105 mEq/L (ref 96–112)
Creat: 0.78 mg/dL (ref 0.50–1.10)
Glucose, Bld: 82 mg/dL (ref 70–99)
Potassium: 4.3 mEq/L (ref 3.5–5.3)
SODIUM: 140 meq/L (ref 135–145)

## 2014-12-25 NOTE — Telephone Encounter (Signed)
Patient was notified of results.  

## 2014-12-25 NOTE — Telephone Encounter (Signed)
-----   Message from Drema DallasAdam R Jaffe, DO sent at 12/25/2014  2:36 PM EDT ----- Lab work looks okay

## 2015-01-18 ENCOUNTER — Ambulatory Visit (INDEPENDENT_AMBULATORY_CARE_PROVIDER_SITE_OTHER): Payer: BLUE CROSS/BLUE SHIELD | Admitting: Neurology

## 2015-01-18 ENCOUNTER — Other Ambulatory Visit: Payer: BLUE CROSS/BLUE SHIELD

## 2015-01-18 ENCOUNTER — Encounter: Payer: Self-pay | Admitting: Neurology

## 2015-01-18 VITALS — BP 118/68 | HR 70 | Resp 16 | Ht 67.0 in | Wt 232.3 lb

## 2015-01-18 DIAGNOSIS — G932 Benign intracranial hypertension: Secondary | ICD-10-CM

## 2015-01-18 NOTE — Progress Notes (Signed)
NEUROLOGY FOLLOW UP OFFICE NOTE  Lindsey Walker [id number]  HISTORY OF PRESENT ILLNESS: Lindsey Walker is a 29 year old right-handed woman with who follows up for papilledema.  She is accompanied by her husband and mother who provide some history.  Ophthalmology note and labs reviewed.  UPDATE: She is taking topiramate 200mg  and Lasix 20mg  twice daily.   She was re-evaluated by Dr. 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  With correction, left visual eye acuity was 20/25 and 20/20 in the right eye.  Intraocular pressures were normal.  She continued to have a 0.3 log unit relative afferent pupillary defect in the left eye. K from 12/25/14 was 4.3. She has been well.  She has not had any headaches.  Since starting the Lasix, she notes that her skin becomes red quickly when she is out in the sun.  She only will stay out for 5 minutes or so.  HISTORY: Earlier this year, she had a routine eye exam with her optometrist.  Bilateral optic nerve swelling was noted on exam.  She reports that she has been having some vision problems over the past month.  She describes this as mostly seeing spots, which comes and goes.  Sometimes it is difficult to focus.  When she feels agitated, she will see specks in her vision as well.  She also reports a dull bi-frontal headache, described as a dull pressure and about 3-4/10 intensity.  It is constant.  It is not positional and not worse whether she is laying down or standing.  She does wake up with the headache but also notes it can get worse in the evening.  She denies visual obscurations.  She sometimes notes pulsatile tinnitus.  She denies feeling of going to pass out.  She denies numbness and tingling sensation.  She denies eye pain or difficulty ambulating.  She denies neck pain.    MRI of the brain with and without contrast performed on 09/11/14 showed prominent  optic nerve sheathes bilaterally.  MRV of head was negative for sinus thrombosis.  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    She underwent an LP on 09/17/14, which revealed an opening pressure of 32 cm H2O.  Closing pressure was 18 cm H2O.  CSF protein was 21 and glucose was 62.  Due to persistent headaches, a high-volume therapeutic LP was performed on 12/02/14, which showed an improved opening pressure of 23 cm water.   Because she has a sulfa allergy, she was started on topiramate instead of Diamox, which was titrated to 200mg .  It helped the headaches overall, but she then developed worsening headache and vision problems.  Follow up eye exam showed slight worsening of papilledema.  She was started on Lasix 20mg  daily, in addition to topiramate.  K from 11/19/14 was 3.9.  Initially, patient told me her mom told her that she had an anaphylactic reaction as a child to sulfa.  However, later her mother wasn't sure and thought she may have had a rash instead.  We tried to refer her to an allergist but they told her there wasn't much to do in testing for an allergy.  She works as a Data processing manager for [employer].  PAST MEDICAL HISTORY: Past Medical History  Diagnosis Date  . Headache   . Endometriosis  MEDICATIONS: Current Outpatient Prescriptions on File Prior to Visit  Medication Sig Dispense Refill  . furosemide (LASIX) 20 MG tablet TAKE 1 TABLET (20 MG TOTAL) BY MOUTH 2 (TWO) TIMES DAILY. 60 tablet 0  . ibuprofen (ADVIL,MOTRIN) 200 MG tablet Take 200 mg by mouth every 6 (six) hours as needed for headache.    Marland Kitchen KLOR-CON M10 10 MEQ tablet     . KLOR-CON M10 10 MEQ tablet TAKE 2 TABLETS (20 MEQ TOTAL) BY MOUTH DAILY. 60 tablet 1  . topiramate (TOPAMAX) 100 MG tablet Take 2 tablets (200 mg total) by mouth at bedtime. (Patient taking differently: Take 100 mg by mouth 2 (two) times daily. ) 60 tablet 5   No  current facility-administered medications on file prior to visit.    ALLERGIES: Allergies  Allergen Reactions  . Sulfa Antibiotics     FAMILY HISTORY: Family History  Problem Relation Age of Onset  . Adopted: Yes  . Thyroid disease Mother   . Parkinson's disease Maternal Grandfather   . Thyroid disease Maternal Grandmother   . Hypertension Maternal Grandmother   . Cancer Maternal Grandmother     bladder    SOCIAL HISTORY: History   Social History  . Marital Status: Single    Spouse Name: N/A  . Number of Children: N/A  . Years of Education: N/A   Occupational History  . Not on file.   Social History Main Topics  . Smoking status: Former Games developer  . Smokeless tobacco: Never Used  . Alcohol Use: 0.0 oz/week    0 Standard drinks or equivalent per week     Comment: socially  . Drug Use: No  . Sexual Activity:    Partners: Male   Other Topics Concern  . Not on file   Social History Narrative    REVIEW OF SYSTEMS: Constitutional: No fevers, chills, or sweats, no generalized fatigue, change in appetite Eyes: No visual changes, double vision, eye pain Ear, nose and throat: No hearing loss, ear pain, nasal congestion, sore throat Cardiovascular: No chest pain, palpitations Respiratory:  No shortness of breath at rest or with exertion, wheezes GastrointestinaI: No nausea, vomiting, diarrhea, abdominal pain, fecal incontinence Genitourinary:  No dysuria, urinary retention or frequency Musculoskeletal:  No neck pain, back pain Integumentary: No rash, pruritus, skin lesions Neurological: as above Psychiatric: No depression, insomnia, anxiety Endocrine: No palpitations, fatigue, diaphoresis, mood swings, change in appetite, change in weight, increased thirst Hematologic/Lymphatic:  No anemia, purpura, petechiae. Allergic/Immunologic: no itchy/runny eyes, nasal congestion, recent allergic reactions, rashes  PHYSICAL EXAM: Filed Vitals:   01/18/15 0929  BP: 118/68    Pulse: 70  Resp: 16   General: No acute distress Head:  Normocephalic/atraumatic Eyes:  Unable to visualize on exam Neck: supple, no paraspinal tenderness, full range of motion Heart:  Regular rate and rhythm Lungs:  Clear to auscultation bilaterally Back: No paraspinal tenderness Neurological Exam: alert and oriented to person, place, and time. Attention span and concentration intact, recent and remote memory intact, fund of knowledge intact.  Speech fluent and not dysarthric, language intact.  CN II-XII intact. Bulk and tone normal, muscle strength 5/5 throughout.  Sensation to light touch, temperature and vibration intact.  Deep tendon reflexes 2+ throughout, toes downgoing.  Finger to nose and heel to shin testing intact.  Gait normal, Romberg negative.  IMPRESSION: Idiopathic intracranial hypertension, improved  PLAN: 1.  Continue topiramate  at bedtime and furosemide  twice daily 2.  Check K level 3.  Wear sun  block 4.  Low sodium diet 5.  Continue weight loss 6.  Follow up in 2 months.  Shon Millet, DO  CC: Danella Penton, MD

## 2015-01-18 NOTE — Patient Instructions (Addendum)
Continue topiramate 200mg  at bedtime and furosemide 20mg  twice daily Will check another BMP looking at the potassium level Wear sun block Follow up in August.

## 2015-01-19 LAB — BASIC METABOLIC PANEL
BUN: 11 mg/dL (ref 6–23)
CHLORIDE: 106 meq/L (ref 96–112)
CO2: 23 mEq/L (ref 19–32)
CREATININE: 0.82 mg/dL (ref 0.50–1.10)
Calcium: 9.5 mg/dL (ref 8.4–10.5)
Glucose, Bld: 88 mg/dL (ref 70–99)
Potassium: 4 mEq/L (ref 3.5–5.3)
SODIUM: 139 meq/L (ref 135–145)

## 2015-01-30 ENCOUNTER — Other Ambulatory Visit: Payer: Self-pay | Admitting: Neurology

## 2015-02-28 ENCOUNTER — Other Ambulatory Visit: Payer: Self-pay | Admitting: Neurology

## 2015-03-03 ENCOUNTER — Other Ambulatory Visit: Payer: Self-pay | Admitting: Neurology

## 2015-03-16 ENCOUNTER — Telehealth: Payer: Self-pay | Admitting: Neurology

## 2015-03-16 NOTE — Telephone Encounter (Signed)
Patient called stating she has had a headache for the last 7 days . Her OB/GYN Dr started her on a birth control medication Ogestrol   as he did not want her to risk getting pregnant while on Topamax . He started her on this medication because  it is one  that Topamax does not lessen the protection of the birth control pill . Patient states she was started on the pill 9 days ago she is asking is it possible that it is the pill ? She has also been under a lot of stress from work . She is just not sure what she needs to do at this point she does not want to do another LP . Please advise

## 2015-03-16 NOTE — Telephone Encounter (Signed)
Per Dr Everlena Cooper         The [place] may well be triggering the headache. She can come in for a cocktail but I would consider discontinuing the birth control pill. Patient was okay with this .

## 2015-03-16 NOTE — Telephone Encounter (Signed)
The Ogestrel may well be triggering the headache.  She can come in for a cocktail but I would consider discontinuing the birth control pill.

## 2015-03-16 NOTE — Telephone Encounter (Signed)
Pt needs to talk to someone about her headache please call [phone]

## 2015-03-23 ENCOUNTER — Ambulatory Visit (INDEPENDENT_AMBULATORY_CARE_PROVIDER_SITE_OTHER): Payer: BLUE CROSS/BLUE SHIELD | Admitting: Neurology

## 2015-03-23 ENCOUNTER — Encounter: Payer: Self-pay | Admitting: Neurology

## 2015-03-23 VITALS — BP 140/80 | HR 100 | Resp 76 | Ht 67.0 in | Wt 227.0 lb

## 2015-03-23 DIAGNOSIS — Z79899 Other long term (current) drug therapy: Secondary | ICD-10-CM

## 2015-03-23 DIAGNOSIS — G932 Benign intracranial hypertension: Secondary | ICD-10-CM | POA: Diagnosis not present

## 2015-03-23 DIAGNOSIS — G44201 Tension-type headache, unspecified, intractable: Secondary | ICD-10-CM | POA: Diagnosis not present

## 2015-03-23 LAB — BASIC METABOLIC PANEL
BUN: 12 mg/dL (ref 7–25)
CO2: 22 mmol/L (ref 20–31)
Calcium: 9.6 mg/dL (ref 8.6–10.2)
Chloride: 114 mmol/L — ABNORMAL HIGH (ref 98–110)
Creat: 0.77 mg/dL (ref 0.50–1.10)
GLUCOSE: 83 mg/dL (ref 65–99)
POTASSIUM: 4.3 mmol/L (ref 3.5–5.3)
SODIUM: 137 mmol/L (ref 135–146)

## 2015-03-23 MED ORDER — PREDNISONE 10 MG PO TABS: ORAL_TABLET | ORAL | Status: DC

## 2015-03-23 NOTE — Progress Notes (Addendum)
NEUROLOGY FOLLOW UP OFFICE NOTE  Lindsey Walker [id number]  HISTORY OF PRESENT ILLNESS: Lindsey Walker is a 29 year old right-handed woman with who follows up for idiopathic intracranial hypertension.  UPDATE: She is taking topiramate 200mg  and Lasix 20mg  twice daily.     She was started on Ogestrel on 03/08/15 and developed increased headaches soon after its initiation.  They are a non-throbbing bifrontal headache with no associated symptoms.  They have been occuring daily and minimally respond to Advil.  She stopped taking the Ogestrel on 03/16/15.  She also recently found out that there is mold at work.  She saw the ophthalmologist again on 03/19/15 and the papilledema has almost completely resolved, with discos nearly completely flat with minimal edema.  Humphrey visual field continues to demonstrate small nasal crescent of visual field loss in the left eye, but also improved.  Visual acuity 20/20 OD and 20/30 OS.  She has been losing weight.  HISTORY: Earlier this year, she had a routine eye exam with her optometrist.  Bilateral optic nerve swelling was noted on exam.  She reports that she has been having some vision problems over the past month.  She describes this as mostly seeing spots, which comes and goes.  Sometimes it is difficult to focus.  When she feels agitated, she will see specks in her vision as well.  She also reports a dull bi-frontal headache, described as a dull pressure and about 3-4/10 intensity.  It is constant.  It is not positional and not worse whether she is laying down or standing.  She does wake up with the headache but also notes it can get worse in the evening.  She denies visual obscurations.  She sometimes notes pulsatile tinnitus.  She denies feeling of going to pass out.  She denies numbness and tingling sensation.  She denies eye pain or difficulty ambulating.  She denies neck pain.    MRI of the brain with and without contrast performed on 09/11/14 showed  prominent optic nerve sheathes bilaterally.  MRV of head was negative for sinus thrombosis.  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    She underwent an LP on 09/17/14, which revealed an opening pressure of 32 cm H2O.  Closing pressure was 18 cm H2O.  CSF protein was 21 and glucose was 62.  Due to persistent headaches, a high-volume therapeutic LP was performed on 12/02/14, which showed an improved opening pressure of 23 cm water.   Because she has a sulfa allergy, she was started on topiramate instead of Diamox, which was titrated to 200mg .  It helped the headaches overall, but she then developed worsening headache and vision problems.  Follow up eye exam showed slight worsening of papilledema.  She was started on Lasix 20mg  daily, in addition to topiramate.  Initially, patient told me her mom told her that she had an anaphylactic reaction as a child to sulfa.  However, later her mother wasn't sure and thought she may have had a rash instead.  We tried to refer her to an allergist but they told her there wasn't much to do in testing for an allergy.  K from 12/25/14 was 4.3.   She works as a Data processing manager for [employer].  PAST MEDICAL HISTORY: Past Medical History  Diagnosis Date  . Headache   . Endometriosis     MEDICATIONS: Current Outpatient Prescriptions on File Prior  to Visit  Medication Sig Dispense Refill  . topiramate (TOPAMAX) 100 MG tablet Take 2 tablets (200 mg total) by mouth at bedtime. (Patient taking differently: Take 100 mg by mouth 2 (two) times daily. ) 60 tablet 5  . furosemide (LASIX) 20 MG tablet TAKE 1 TABLET (20 MG TOTAL) BY MOUTH 2 (TWO) TIMES DAILY. 60 tablet 0  . furosemide (LASIX) 20 MG tablet TAKE 1 TABLET (20 MG TOTAL) BY MOUTH DAILY. 30 tablet 1  . ibuprofen (ADVIL,MOTRIN) 200 MG tablet Take 200 mg by mouth every 6 (six) hours as needed for headache.    Marland Kitchen KLOR-CON M10 10 MEQ  tablet     . KLOR-CON M10 10 MEQ tablet TAKE 2 TABLETS (20 MEQ TOTAL) BY MOUTH DAILY. 60 tablet 1  . KLOR-CON M10 10 MEQ tablet TAKE 2 TABLETS (20 MEQ TOTAL) BY MOUTH DAILY. 60 tablet 1   No current facility-administered medications on file prior to visit.    ALLERGIES: Allergies  Allergen Reactions  . Sulfa Antibiotics     FAMILY HISTORY: Family History  Problem Relation Age of Onset  . Adopted: Yes  . Thyroid disease Mother   . Parkinson's disease Maternal Grandfather   . Thyroid disease Maternal Grandmother   . Hypertension Maternal Grandmother   . Cancer Maternal Grandmother     bladder    SOCIAL HISTORY: Social History   Social History  . Marital Status: Single    Spouse Name: N/A  . Number of Children: N/A  . Years of Education: N/A   Occupational History  . Not on file.   Social History Main Topics  . Smoking status: Former Games developer  . Smokeless tobacco: Never Used  . Alcohol Use: 0.0 oz/week    0 Standard drinks or equivalent per week     Comment: socially  . Drug Use: No  . Sexual Activity:    Partners: Male   Other Topics Concern  . Not on file   Social History Narrative    REVIEW OF SYSTEMS: Constitutional: No fevers, chills, or sweats, no generalized fatigue, change in appetite Eyes: No visual changes, double vision, eye pain Ear, nose and throat: No hearing loss, ear pain, nasal congestion, sore throat Cardiovascular: No chest pain, palpitations Respiratory:  No shortness of breath at rest or with exertion, wheezes GastrointestinaI: No nausea, vomiting, diarrhea, abdominal pain, fecal incontinence Genitourinary:  No dysuria, urinary retention or frequency Musculoskeletal:  No neck pain, back pain Integumentary: No rash, pruritus, skin lesions Neurological: as above Psychiatric: No depression, insomnia, anxiety Endocrine: No palpitations, fatigue, diaphoresis, mood swings, change in appetite, change in weight, increased  thirst Hematologic/Lymphatic:  No anemia, purpura, petechiae. Allergic/Immunologic: no itchy/runny eyes, nasal congestion, recent allergic reactions, rashes  PHYSICAL EXAM: Filed Vitals:   03/23/15 1056  BP: 140/80  Pulse: 100  Resp: 76   General: No acute distress.  Patient appears well-groomed.   Head:  Normocephalic/atraumatic Eyes:  Fundoscopic exam unremarkable without vessel changes, exudates, hemorrhages or papilledema. Neck: supple, no paraspinal tenderness, full range of motion Heart:  Regular rate and rhythm Lungs:  Clear to auscultation bilaterally Back: No paraspinal tenderness Neurological Exam: alert and oriented to person, place, and time. Attention span and concentration intact, recent and remote memory intact, fund of knowledge intact.  Speech fluent and not dysarthric, language intact.  Slight left APD.  CN II-XII intact. Fundoscopic exam unremarkable without vessel changes, exudates, hemorrhages or papilledema.  Bulk and tone normal, muscle strength 5/5 throughout.  Sensation to light  touch, temperature and vibration intact.  Deep tendon reflexes 2+ throughout, toes downgoing.  Finger to nose and heel to shin testing intact.  Gait normal, Romberg negative.  IMPRESSION: Daily tension-type headaches over the past 2 weeks.  The birth control pill and mold may be possible triggers. Idiopathic intracranial hypertension, stable  PLAN: 1.  Prednisone taper to break cycle of recent daily headaches 2.  Aleve instead of Advil for headaches, limited to no more than 2 days out of the week (not to be taken while on prednisone) 3.  Continue topiramate 200mg  daily and Lasix 20mg  twice daily.  Check BMP (potassium level) 4.  Folic acid 1mg  daily (to be taken while on topiramate) 5.  Blood pressure is mildly elevated today.  She has no prior history of elevated blood pressure.  May be related to not feeling well.  Recommend having it rechecked with PCP. 6.  Follow up in 2  months.  15 minutes spent face to face with patient, over 50% spent discussing diagnosis and management.  Shon Millet, DO  CC:  Danella Penton, MD

## 2015-03-23 NOTE — Patient Instructions (Signed)
1.  Continue topiramate  daily and Lasix  twice daily 2.  Will prescribe prednisone  taper.  Take 6tabs x1day, then 5tabs x1day, then 4tabs x1day, then 3tabs x1day, then 2tabs x1day, then 1tab x1day, then STOP 3.  Instead of Advil, try taking Aleve/naproxen .  Do not take while on the prednisone 4.  Take  of folic acid daily while on topiramate 5.  Follow up in 2 months.  Call sooner if headaches do not improve

## 2015-03-25 ENCOUNTER — Telehealth: Payer: Self-pay | Admitting: *Deleted

## 2015-03-25 NOTE — Telephone Encounter (Signed)
-----   Message from Drema Dallas, DO sent at 03/24/2015 10:50 AM EDT ----- Labs are unremarkable

## 2015-03-25 NOTE — Telephone Encounter (Signed)
Labs are unremarkable patient is aware

## 2015-03-28 ENCOUNTER — Other Ambulatory Visit: Payer: Self-pay | Admitting: Neurology

## 2015-04-05 ENCOUNTER — Other Ambulatory Visit: Payer: Self-pay | Admitting: Neurology

## 2015-04-18 ENCOUNTER — Other Ambulatory Visit: Payer: Self-pay | Admitting: Neurology

## 2015-05-20 ENCOUNTER — Other Ambulatory Visit: Payer: Self-pay | Admitting: Neurology

## 2015-05-20 NOTE — Telephone Encounter (Signed)
Rx sent 

## 2015-05-25 ENCOUNTER — Ambulatory Visit: Payer: BLUE CROSS/BLUE SHIELD | Admitting: Neurology

## 2015-05-30 ENCOUNTER — Other Ambulatory Visit: Payer: Self-pay | Admitting: Neurology

## 2015-05-30 DIAGNOSIS — G932 Benign intracranial hypertension: Secondary | ICD-10-CM

## 2015-05-31 NOTE — Telephone Encounter (Signed)
Last OV: 03/23/15 Next OV: 06/13/15

## 2015-06-14 ENCOUNTER — Encounter: Payer: Self-pay | Admitting: Neurology

## 2015-06-14 ENCOUNTER — Other Ambulatory Visit (INDEPENDENT_AMBULATORY_CARE_PROVIDER_SITE_OTHER): Payer: BLUE CROSS/BLUE SHIELD

## 2015-06-14 ENCOUNTER — Ambulatory Visit (INDEPENDENT_AMBULATORY_CARE_PROVIDER_SITE_OTHER): Payer: BLUE CROSS/BLUE SHIELD | Admitting: Neurology

## 2015-06-14 VITALS — BP 136/74 | HR 81 | Ht 67.0 in | Wt 234.1 lb

## 2015-06-14 DIAGNOSIS — Z79899 Other long term (current) drug therapy: Secondary | ICD-10-CM

## 2015-06-14 DIAGNOSIS — G932 Benign intracranial hypertension: Secondary | ICD-10-CM | POA: Diagnosis not present

## 2015-06-14 LAB — BASIC METABOLIC PANEL
BUN: 15 mg/dL (ref 6–23)
CO2: 24 meq/L (ref 19–32)
Calcium: 9.7 mg/dL (ref 8.4–10.5)
Chloride: 107 mEq/L (ref 96–112)
Creatinine, Ser: 0.81 mg/dL (ref 0.40–1.20)
GFR: 89.37 mL/min (ref >60.00)
GLUCOSE: 86 mg/dL (ref 70–99)
Potassium: 4.3 mEq/L (ref 3.5–5.1)
SODIUM: 139 meq/L (ref 135–145)

## 2015-06-14 MED ORDER — FUROSEMIDE 20 MG PO TABS: 20.0000 mg | ORAL_TABLET | Freq: Two times a day (BID) | ORAL | Status: DC

## 2015-06-14 NOTE — Progress Notes (Signed)
Chart forwarded.  

## 2015-06-14 NOTE — Progress Notes (Signed)
NEUROLOGY FOLLOW UP OFFICE NOTE  Lindsey Walker [id number]  HISTORY OF PRESENT ILLNESS: Lindsey Walker is a 29 year old right-handed woman with who follows up for idiopathic intracranial hypertension.  UPDATE: She is taking topiramate 200mg  and Lasix 20mg  twice daily.   Most recent K from August was 4.3.  Papilledema has almost resolved based on last eye exam in August.  She developed daily tension-type headaches after [facility], so she discontinued this.  Her work was evaluated for mold and she was told that they had an acceptable level of spores.  She was prescribed a prednisone taper, which helped break the headaches and now she only has a mild headache once in a while.  She is feeling well.  HISTORY: Earlier this year, she had a routine eye exam with her optometrist.  Bilateral optic nerve swelling was noted on exam.  She reports that she has been having some vision problems over the past month.  She describes this as mostly seeing spots, which comes and goes.  Sometimes it is difficult to focus.  When she feels agitated, she will see specks in her vision as well.  She also reports a dull bi-frontal headache, described as a dull pressure and about 3-4/10 intensity.  It is constant.  It is not positional and not worse whether she is laying down or standing.  She does wake up with the headache but also notes it can get worse in the evening.  She denies visual obscurations.  She sometimes notes pulsatile tinnitus.  She denies feeling of going to pass out.  She denies numbness and tingling sensation.  She denies eye pain or difficulty ambulating.  She denies neck pain.    MRI of the brain with and without contrast performed on 09/11/14 showed prominent optic nerve sheathes bilaterally.  MRV of head was negative for sinus thrombosis.  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    She underwent an  LP on 09/17/14, which revealed an opening pressure of 32 cm H2O.  Closing pressure was 18 cm H2O.  CSF protein was 21 and glucose was 62.  Due to persistent headaches, a high-volume therapeutic LP was performed on 12/02/14, which showed an improved opening pressure of 23 cm water.   Because she has a sulfa allergy, she was started on topiramate instead of Diamox, which was titrated to 200mg .  It helped the headaches overall, but she then developed worsening headache and vision problems.  Follow up eye exam showed slight worsening of papilledema.  She was started on Lasix 20mg  daily, in addition to topiramate.  Initially, patient told me her mom told her that she had an anaphylactic reaction as a child to sulfa.  However, later her mother wasn't sure and thought she may have had a rash instead.  We tried to refer her to an allergist but they told her there wasn't much to do in testing for an allergy.  PAST MEDICAL HISTORY: Past Medical History  Diagnosis Date  . Headache   . Endometriosis     MEDICATIONS: Current Outpatient Prescriptions on File Prior to Visit  Medication Sig Dispense Refill  . ibuprofen (ADVIL,MOTRIN) 200 MG tablet Take 200 mg by mouth every 6 (six) hours as needed for headache.    Marland Kitchen. KLOR-CON M10 10 MEQ tablet     . KLOR-CON M10 10 MEQ tablet TAKE 2 TABLETS (20 MEQ TOTAL) BY MOUTH  DAILY. 60 tablet 1  . topiramate (TOPAMAX) 100 MG tablet TAKE 2 TABLETS (200 MG TOTAL) BY MOUTH AT BEDTIME. 60 tablet 5  . OGESTREL 0.5-50 MG-MCG tablet Take 1 tablet by mouth daily.  11   No current facility-administered medications on file prior to visit.    ALLERGIES: Allergies  Allergen Reactions  . Sulfa Antibiotics     FAMILY HISTORY: Family History  Problem Relation Age of Onset  . Adopted: Yes  . Thyroid disease Mother   . Parkinson's disease Maternal Grandfather   . Thyroid disease Maternal Grandmother   . Hypertension Maternal Grandmother   . Cancer Maternal Grandmother      bladder    SOCIAL HISTORY: Social History   Social History  . Marital Status: Single    Spouse Name: N/A  . Number of Children: N/A  . Years of Education: N/A   Occupational History  . Not on file.   Social History Main Topics  . Smoking status: Former Games developer  . Smokeless tobacco: Never Used  . Alcohol Use: 0.0 oz/week    0 Standard drinks or equivalent per week     Comment: socially  . Drug Use: No  . Sexual Activity:    Partners: Male   Other Topics Concern  . Not on file   Social History Narrative    REVIEW OF SYSTEMS: Constitutional: No fevers, chills, or sweats, no generalized fatigue, change in appetite Eyes: No visual changes, double vision, eye pain Ear, nose and throat: No hearing loss, ear pain, nasal congestion, sore throat Cardiovascular: No chest pain, palpitations Respiratory:  No shortness of breath at rest or with exertion, wheezes GastrointestinaI: No nausea, vomiting, diarrhea, abdominal pain, fecal incontinence Genitourinary:  No dysuria, urinary retention or frequency Musculoskeletal:  No neck pain, back pain Integumentary: No rash, pruritus, skin lesions Neurological: as above Psychiatric: No depression, insomnia, anxiety Endocrine: No palpitations, fatigue, diaphoresis, mood swings, change in appetite, change in weight, increased thirst Hematologic/Lymphatic:  No anemia, purpura, petechiae. Allergic/Immunologic: no itchy/runny eyes, nasal congestion, recent allergic reactions, rashes  PHYSICAL EXAM: Filed Vitals:   06/14/15 1057  BP: 136/74  Pulse: 81   General: No acute distress.  Patient appears well-groomed. Head:  Normocephalic/atraumatic Eyes:  Fundoscopic exam unremarkable without vessel changes, exudates, hemorrhages or papilledema. Neck: supple, no paraspinal tenderness, full range of motion Heart:  Regular rate and rhythm Lungs:  Clear to auscultation bilaterally Back: No paraspinal tenderness Neurological Exam: alert and  oriented to person, place, and time. Attention span and concentration intact, recent and remote memory intact, fund of knowledge intact.  Speech fluent and not dysarthric, language intact.  CN II-XII intact. Fundoscopic exam unremarkable without vessel changes, exudates, hemorrhages or papilledema.  Bulk and tone normal, muscle strength 5/5 throughout.  Sensation to light touch intact.  Deep tendon reflexes 2+ throughout.  Finger to nose and heel to shin testing intact.  Gait normal.  IMPRESSION: Idiopathic intracranial hypertension  PLAN: 1.  Continue Lasix  twice daily.  Will recheck BMP to follow up K today. 2.  Continue topiramate  daily.  Should take folic acid  daily.  Advised not to get pregnant as well. 3.  Follow up in 6 months.  15 minutes spent face to face with patient, over 50% spent discussing management.  Shon Millet, DO  CC:  Danella Penton, MD

## 2015-06-14 NOTE — Patient Instructions (Signed)
1.  Continue topiramate 200mg  daily.  Take folic acid 4mg  daily.  Do not get pregnant while on it.  If you wish to get pregnant, let me know. 2.  Continue Lasix 20mg  twice daily.  Will check a BMP 3.  Follow up in 6 months.

## 2015-06-28 ENCOUNTER — Other Ambulatory Visit: Payer: Self-pay | Admitting: Neurology

## 2015-08-26 ENCOUNTER — Telehealth: Payer: Self-pay | Admitting: Neurology

## 2015-08-26 ENCOUNTER — Other Ambulatory Visit: Payer: Self-pay | Admitting: *Deleted

## 2015-08-26 MED ORDER — PREDNISONE 10 MG PO TABS: ORAL_TABLET | ORAL | Status: DC

## 2015-08-26 NOTE — Telephone Encounter (Signed)
Pt wants to talk to someone about having headaches. She has one for 7 days now  Pt phone number is [phone]  Or [phone]

## 2015-08-26 NOTE — Telephone Encounter (Signed)
We can prescribe her prednisone taper.   tablet.  Take 6tabs x1day, then 5tabs x1day, then 4tabs x1day, then 3tabs x1day, then 2tabs x1day, then 1tab x1day, then STOP

## 2015-08-26 NOTE — Telephone Encounter (Signed)
Patient notified and Rx sent

## 2015-08-26 NOTE — Telephone Encounter (Signed)
Patient is having tension type headaches.  Prednisone taper has worked in the past.  She said that they are not the pseudotumor type headaches.  Please advise.

## 2015-08-31 ENCOUNTER — Other Ambulatory Visit: Payer: Self-pay | Admitting: Neurology

## 2015-09-02 ENCOUNTER — Telehealth: Payer: Self-pay | Admitting: Neurology

## 2015-09-02 ENCOUNTER — Ambulatory Visit (INDEPENDENT_AMBULATORY_CARE_PROVIDER_SITE_OTHER): Payer: Managed Care, Other (non HMO)

## 2015-09-02 DIAGNOSIS — G43009 Migraine without aura, not intractable, without status migrainosus: Secondary | ICD-10-CM | POA: Diagnosis not present

## 2015-09-02 MED ORDER — DIPHENHYDRAMINE HCL 50 MG/ML IJ SOLN
25.0000 mg | Freq: Once | INTRAMUSCULAR | Status: AC
  Administered 2015-09-02: 25 mg via INTRAMUSCULAR

## 2015-09-02 MED ORDER — SUMATRIPTAN SUCCINATE 100 MG PO TABS: 100.0000 mg | ORAL_TABLET | Freq: Once | ORAL | Status: DC | PRN

## 2015-09-02 MED ORDER — METOCLOPRAMIDE HCL 5 MG/ML IJ SOLN
10.0000 mg | Freq: Once | INTRAVENOUS | Status: AC
  Administered 2015-09-02: 10 mg via INTRAMUSCULAR

## 2015-09-02 MED ORDER — KETOROLAC TROMETHAMINE 60 MG/2ML IM SOLN
60.0000 mg | Freq: Once | INTRAMUSCULAR | Status: AC
  Administered 2015-09-02: 60 mg via INTRAMUSCULAR

## 2015-09-02 NOTE — Telephone Encounter (Signed)
Message relayed to patient. Verbalized understanding and denied questions. Pt is going to call someone to see if they can bring her over for a headache cocktail. Rx sent.

## 2015-09-02 NOTE — Telephone Encounter (Signed)
Pt states that she is still having headache she just finished her prenisone yesterday please call [phone] or [phone]

## 2015-09-02 NOTE — Telephone Encounter (Signed)
Spoke with patient. She finished her predinisone taper yesterday. Headache is still as severe. She is waking with headache of pain level 6-7/10, progresses to an 8-9/10 by the time she get's home from work. NSAIDs do help with pain, but symptoms return quickly, "like I hit a wall". Pt complains of pain that goes across her eyes, will sometimes wrap around her head, like a tension headache. Is experiencing nausea, and sensitivity to light and noise occasionally. Pt states these headaches are different from previous ones as there is no weird flashes in her eyes, or vision loss. Please advise.

## 2015-09-02 NOTE — Telephone Encounter (Signed)
They sound like they may now be migraines considering the nausea.  If she has a driver, she can come in for a headache cocktail (toradol /Benadryl /Reglan ).    We can then prescribe her sumatriptan .  She can take 1 tablet at earliest onset of headache and repeat dose once in 2 hours if needed (limited to no more than 2 tablets in 24 hours).  She should limit to no more than 2 days out of the week.

## 2015-09-02 NOTE — Progress Notes (Signed)
Pt was administered headache cocktail. Tolerated well. Had significant other with her to drive her home.

## 2015-09-03 ENCOUNTER — Ambulatory Visit: Payer: Managed Care, Other (non HMO)

## 2015-09-09 ENCOUNTER — Other Ambulatory Visit: Payer: Self-pay | Admitting: Neurology

## 2015-09-10 NOTE — Telephone Encounter (Signed)
Last OV: 06/14/15 Next OV: 12/13/15 1.  Continue Lasix  twice daily.  Will recheck BMP to follow up K today.

## 2015-11-02 ENCOUNTER — Other Ambulatory Visit: Payer: Self-pay | Admitting: Neurology

## 2015-11-02 NOTE — Telephone Encounter (Signed)
Last OV: 06/14/15 Next OV: [id number]

## 2015-11-21 ENCOUNTER — Other Ambulatory Visit: Payer: Self-pay | Admitting: Neurology

## 2015-11-22 NOTE — Telephone Encounter (Signed)
Last OV: 06/14/15 Next OV: 5/817 2. Continue topiramate 200mg  daily.

## 2015-12-06 ENCOUNTER — Other Ambulatory Visit: Payer: Self-pay | Admitting: Neurology

## 2015-12-06 NOTE — Telephone Encounter (Signed)
Last OV: 06/23/15 Next OV: 12/13/15 Continue topiramate 200mg  daily.

## 2015-12-13 ENCOUNTER — Ambulatory Visit: Payer: BLUE CROSS/BLUE SHIELD | Admitting: Neurology

## 2015-12-13 DIAGNOSIS — Z029 Encounter for administrative examinations, unspecified: Secondary | ICD-10-CM

## 2016-03-14 ENCOUNTER — Ambulatory Visit (INDEPENDENT_AMBULATORY_CARE_PROVIDER_SITE_OTHER): Payer: Managed Care, Other (non HMO) | Admitting: Neurology

## 2016-03-14 ENCOUNTER — Encounter: Payer: Self-pay | Admitting: Neurology

## 2016-03-14 ENCOUNTER — Other Ambulatory Visit (INDEPENDENT_AMBULATORY_CARE_PROVIDER_SITE_OTHER): Payer: Managed Care, Other (non HMO)

## 2016-03-14 VITALS — BP 114/82 | HR 92 | Ht 67.0 in | Wt 237.0 lb

## 2016-03-14 DIAGNOSIS — G43009 Migraine without aura, not intractable, without status migrainosus: Secondary | ICD-10-CM

## 2016-03-14 DIAGNOSIS — G932 Benign intracranial hypertension: Secondary | ICD-10-CM | POA: Diagnosis not present

## 2016-03-14 DIAGNOSIS — Z79899 Other long term (current) drug therapy: Secondary | ICD-10-CM | POA: Diagnosis not present

## 2016-03-14 LAB — BASIC METABOLIC PANEL
BUN: 17 mg/dL (ref 6–23)
CALCIUM: 9.3 mg/dL (ref 8.4–10.5)
CO2: 24 meq/L (ref 19–32)
CREATININE: 0.77 mg/dL (ref 0.40–1.20)
Chloride: 108 mEq/L (ref 96–112)
GFR: 94.25 mL/min (ref >60.00)
GLUCOSE: 100 mg/dL — AB (ref 70–99)
Potassium: 4.2 mEq/L (ref 3.5–5.1)
Sodium: 140 mEq/L (ref 135–145)

## 2016-03-14 MED ORDER — RIZATRIPTAN BENZOATE 10 MG PO TBDP: ORAL_TABLET | ORAL | 3 refills | Status: DC

## 2016-03-14 NOTE — Patient Instructions (Signed)
Migraine Recommendations: 1.  Stop Lasix and potassium.  Continue Topamax  at bedtime.  Take folic acid  daily. 2.  Stop Imitrex.  Take rizatriptan  at earliest onset of headache.  May repeat dose once in 2 hours if needed.  Do not exceed two tablets in 24 hours. 3.  Limit use of pain relievers to no more than 2 days out of the week.  These medications include acetaminophen, ibuprofen, triptans and narcotics.  This will help reduce risk of rebound headaches. 4.  Be aware of common food triggers such as processed sweets, processed foods with nitrites (such as deli meat, hot dogs, sausages), foods with MSG, alcohol (such as wine), chocolate, certain cheeses, certain fruits (dried fruits, some citrus fruit), vinegar, diet soda. 4.  Avoid caffeine 5.  Routine exercise 6.  Proper sleep hygiene 7.  Stay adequately hydrated with water 8.  Keep a headache diary. 9.  Maintain proper stress management. 10.  Do not skip meals. 11.  Consider supplements:  Magnesium oxide  to  daily, riboflavin , Coenzyme Q 10  three times daily 12.  Follow up in 3 months. 13.  Will check basic metabolic panel.

## 2016-03-14 NOTE — Progress Notes (Signed)
NEUROLOGY FOLLOW UP OFFICE NOTE  Lindsey Walker [id number]  HISTORY OF PRESENT ILLNESS: Lindsey Walker is a 30 year old right-handed woman with who follows up for idiopathic intracranial hypertension.  UPDATE: She is taking topiramate 200mg  and Lasix 20mg  twice daily.  She takes potassium supplements with the Lasix.  She [facility] on 08/13/15, and her optic discs look okay.  She has a follow up appointment at the end of August.  She reports migraine headaches.  They are bi-frontal/temporal, pounding, 10/10 pain.  They are associated with nausea, photophobia, phonophobia and blurred vision.  She takes sumatriptan 100mg  or Advil and goes to sleep.  Headaches may last from 1 to 2 days.  They occur about twice a week.  She has no prior history of migraines but her uncle and mother have chronic migraines.  She and her husband would like to try and get pregnant in about a year.  HISTORY: Earlier this year, she had a routine eye exam with her optometrist.  Bilateral optic nerve swelling was noted on exam.  She reports that she has been having some vision problems over the past month.  She describes this as mostly seeing spots, which comes and goes.  Sometimes it is difficult to focus.  When she feels agitated, she will see specks in her vision as well.  She also reports a dull bi-frontal headache, described as a dull pressure and about 3-4/10 intensity.  It is constant.  It is not positional and not worse whether she is laying down or standing.  She does wake up with the headache but also notes it can get worse in the evening.  She denies visual obscurations.  She sometimes notes pulsatile tinnitus.  She denies feeling of going to pass out.  She denies numbness and tingling sensation.  She denies eye pain or difficulty ambulating.  She denies neck pain.    MRI of the brain with and without contrast performed on 09/11/14 showed prominent optic nerve sheathes  bilaterally.  MRV of head was negative for sinus thrombosis.  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    She underwent an LP on 09/17/14, which revealed an opening pressure of 32 cm H2O.  Closing pressure was 18 cm H2O.  CSF protein was 21 and glucose was 62.  Due to persistent headaches, a high-volume therapeutic LP was performed on 12/02/14, which showed an improved opening pressure of 23 cm water.   Because she has a sulfa allergy, she was started on topiramate instead of Diamox, which was titrated to 200mg .  It helped the headaches overall, but she then developed worsening headache and vision problems.  Follow up eye exam showed slight worsening of papilledema.  She was started on Lasix 20mg  daily, in addition to topiramate.  Initially, patient told me her mom told her that she had an anaphylactic reaction as a child to sulfa.  However, later her mother wasn't sure and thought she may have had a rash instead.  We tried to refer her to an allergist but they told her there wasn't much to do in testing for an allergy.  PAST MEDICAL HISTORY: Past Medical History:  Diagnosis Date  . Endometriosis   . Headache     MEDICATIONS: Current Outpatient Prescriptions on File Prior to Visit  Medication Sig Dispense Refill  . ibuprofen (ADVIL,MOTRIN) 200 MG tablet Take 200 mg by  mouth every 6 (six) hours as needed for headache.    . topiramate (TOPAMAX) 200 MG tablet Take 1 tablet (200 mg total) by mouth daily. 30 tablet 5   No current facility-administered medications on file prior to visit.     ALLERGIES: Allergies  Allergen Reactions  . Sulfa Antibiotics     FAMILY HISTORY: Family History  Problem Relation Age of Onset  . Adopted: Yes  . Thyroid disease Mother   . Parkinson's disease Maternal Grandfather   . Thyroid disease Maternal Grandmother   . Hypertension Maternal Grandmother   . Cancer Maternal  Grandmother     bladder    SOCIAL HISTORY: Social History   Social History  . Marital status: Single    Spouse name: N/A  . Number of children: N/A  . Years of education: N/A   Occupational History  . Not on file.   Social History Main Topics  . Smoking status: Former Games developer  . Smokeless tobacco: Never Used  . Alcohol use 0.0 oz/week     Comment: socially  . Drug use: No  . Sexual activity: Yes    Partners: Male   Other Topics Concern  . Not on file   Social History Narrative  . No narrative on file    REVIEW OF SYSTEMS: Constitutional: No fevers, chills, or sweats, no generalized fatigue, change in appetite Eyes: No visual changes, double vision, eye pain Ear, nose and throat: No hearing loss, ear pain, nasal congestion, sore throat Cardiovascular: No chest pain, palpitations Respiratory:  No shortness of breath at rest or with exertion, wheezes GastrointestinaI: No nausea, vomiting, diarrhea, abdominal pain, fecal incontinence Genitourinary:  No dysuria, urinary retention or frequency Musculoskeletal:  No neck pain, back pain Integumentary: No rash, pruritus, skin lesions Neurological: as above Psychiatric: No depression, insomnia, anxiety Endocrine: No palpitations, fatigue, diaphoresis, mood swings, change in appetite, change in weight, increased thirst Hematologic/Lymphatic:  No purpura, petechiae. Allergic/Immunologic: no itchy/runny eyes, nasal congestion, recent allergic reactions, rashes  PHYSICAL EXAM: Vitals:   03/14/16 0849  BP: 114/82  Pulse: 92   General: No acute distress.  Patient appears well-groomed.   Head:  Normocephalic/atraumatic Eyes:  Fundi examined but not visualized Neck: supple, no paraspinal tenderness, full range of motion Heart:  Regular rate and rhythm Lungs:  Clear to auscultation bilaterally Back: No paraspinal tenderness Neurological Exam: alert and oriented to person, place, and time. Attention span and concentration  intact, recent and remote memory intact, fund of knowledge intact.  Speech fluent and not dysarthric, language intact.  CN II-XII intact. Bulk and tone normal, muscle strength 5/5 throughout.  Sensation to light touch, temperature and vibration intact.  Deep tendon reflexes 2+ throughout, toes downgoing.  Finger to nose and heel to shin testing intact.  Gait normal.  IMPRESSION: Migraine without aura Idiopathic intracranial hypertension, improved  PLAN: 1.  Will discontinue Lasix and potassium.  We will see how her ophthalmologic exam looks at the end of the month. 2.  She will continue topiramate  at bedtime for now.  She is not to get pregnant until we discontinue topiramate.  I have asked her to take folic acid  daily. 3.  Instead of sumatriptan, we will try Maxalt  for abortive therapy. 4.  Will check BMP 5.  Follow up in 3 months.  26 minutes spent face to face with patient, over 50% spent counseling.  Shon Millet, DO  CC:  Joycelyn Rua, MD

## 2016-03-14 NOTE — Progress Notes (Signed)
Chart forwarded to Dr. Joycelyn RuaStephen Meyers

## 2016-03-31 ENCOUNTER — Other Ambulatory Visit: Payer: Self-pay | Admitting: Neurology

## 2016-05-14 ENCOUNTER — Encounter (HOSPITAL_BASED_OUTPATIENT_CLINIC_OR_DEPARTMENT_OTHER): Payer: Self-pay | Admitting: *Deleted

## 2016-05-14 ENCOUNTER — Emergency Department (HOSPITAL_BASED_OUTPATIENT_CLINIC_OR_DEPARTMENT_OTHER)
Admission: EM | Admit: 2016-05-14 | Discharge: 2016-05-14 | Disposition: A | Discharge status: Home / Self Care | Payer: Managed Care, Other (non HMO) | Attending: Emergency Medicine | Admitting: Emergency Medicine

## 2016-05-14 DIAGNOSIS — J029 Acute pharyngitis, unspecified: Secondary | ICD-10-CM | POA: Diagnosis present

## 2016-05-14 DIAGNOSIS — Z79899 Other long term (current) drug therapy: Secondary | ICD-10-CM | POA: Diagnosis not present

## 2016-05-14 DIAGNOSIS — Z87891 Personal history of nicotine dependence: Secondary | ICD-10-CM | POA: Diagnosis not present

## 2016-05-14 HISTORY — DX: Benign intracranial hypertension: G93.2

## 2016-05-14 LAB — RAPID STREP SCREEN (MED CTR MEBANE ONLY): STREPTOCOCCUS, GROUP A SCREEN (DIRECT): NEGATIVE

## 2016-05-14 MED ORDER — ACETAMINOPHEN-CODEINE 120-12 MG/5ML PO SOLN: 10.0000 mL | ORAL | 0 refills | Status: DC | PRN

## 2016-05-14 MED ORDER — PENICILLIN G BENZATHINE 1200000 UNIT/2ML IM SUSP
1.2000 10*6.[IU] | Freq: Once | INTRAMUSCULAR | Status: AC
  Administered 2016-05-14: 1.2 10*6.[IU] via INTRAMUSCULAR
  Filled 2016-05-14: qty 2

## 2016-05-14 NOTE — ED Provider Notes (Signed)
[facility]   CSN: [id number] Arrival date & time: 05/14/16  0859     History   Chief Complaint Chief Complaint  Patient presents with  . Sore Throat    HPI Lindsey Walker is a 30 y.o. female.  HPI Patient presents to the emergency department with sore throat.  This been ongoing since yesterday.  The patient states that she has a child that was diagnosed with strep pharyngitis and is currently taking antibiotics.  She states that she has had fever intermittently since yesterday morning and taking ibuprofen.  Patient states that nothing seems make the condition better or worseThe patient denies chest pain, shortness of breath, headache,blurred vision, neck pain,  cough, weakness, numbness, dizziness, anorexia, edema, abdominal pain, nausea, vomiting, diarrhea, rash, back pain, dysuria, hematemesis, bloody stool, near syncope, or syncope. Past Medical History:  Diagnosis Date  . Endometriosis   . Headache   . Pseudotumor cerebri 2016    Patient Active Problem List   Diagnosis Date Noted  . Idiopathic intracranial hypertension 12/21/2014    Past Surgical History:  Procedure Laterality Date  . ABLATION ON ENDOMETRIOSIS  2003    OB History    No data available       Home Medications    Prior to Admission medications   Medication Sig Start Date End Date Taking? Authorizing Provider  rizatriptan (MAXALT-MLT) 10 MG disintegrating tablet Take 1tablet.  May repeat once in 2 hours if needed 03/14/16  Yes Adam Mliss Fritz Jaffe, DO  topiramate (TOPAMAX) 200 MG tablet Take 1 tablet (200 mg total) by mouth daily. 12/06/15  Yes Adam Mliss Fritz Jaffe, DO  ibuprofen (ADVIL,MOTRIN) 200 MG tablet Take 200 mg by mouth every 6 (six) hours as needed for headache.    Historical Provider, MD    Family History Family History  Problem Relation Age of Onset  . Adopted: Yes  . Thyroid disease Mother   . Parkinson's disease Maternal Grandfather   . Thyroid disease Maternal  Grandmother   . Hypertension Maternal Grandmother   . Cancer Maternal Grandmother     bladder    Social History Social History  Substance Use Topics  . Smoking status: Former Games developermoker  . Smokeless tobacco: Never Used  . Alcohol use 0.0 oz/week     Comment: occasionally throughout year     Allergies   Sulfa antibiotics   Review of Systems Review of Systems  All other systems negative except as documented in the HPI. All pertinent positives and negatives as reviewed in the HPI. Physical Exam Updated Vital Signs BP (!) 132/52 (BP Location: Right Arm)   Pulse (!) 122   Temp 99.5 F (37.5 C) (Oral)   Resp 20   Ht 5\' 7"  (1.702 m)   Wt 117.9 kg   LMP 05/11/2016   SpO2 98%   BMI 40.72 kg/m   Physical Exam  Constitutional: She appears well-developed and well-nourished.  HENT:  Head: Normocephalic and atraumatic.  Mouth/Throat: Mucous membranes are normal. No trismus in the jaw. No uvula swelling. Oropharyngeal exudate, posterior oropharyngeal edema and posterior oropharyngeal erythema present. No tonsillar abscesses. Tonsils are 2+ on the right. Tonsils are 2+ on the left. Tonsillar exudate.  Eyes: Pupils are equal, round, and reactive to light.  Nursing note and vitals reviewed.    ED Treatments / Results  Labs (all labs ordered are listed, but only abnormal results are displayed) Labs Reviewed  RAPID STREP SCREEN (NOT AT [facility])  CULTURE, GROUP A STREP (  [facility])    EKG  EKG Interpretation None       Radiology No results found.  Procedures Procedures (including critical care time)  Medications Ordered in ED Medications  penicillin g benzathine (BICILLIN LA) 1200000 UNIT/2ML injection 1.2 Million Units (not administered)     Initial Impression / Assessment and Plan / ED Course  I have reviewed the triage vital signs and the nursing notes.  Pertinent labs & imaging results that were available during my care of the patient were reviewed by me and considered  in my medical decision making (see chart for details).  Clinical Course    Patient is given Bicillin LA based on the fact that she has erythema consistent with pharyngitis along that she has been exposed to strep.  Patient is advised return here as needed.  Told to follow up with her primary care doctor.  Patient agrees the plan and all questions were answered.  I advised the patient to increase her fluid intake and rest as much as possible  Final Clinical Impressions(s) / ED Diagnoses   Final diagnoses:  None    New Prescriptions New Prescriptions   No medications on file     Charlestine Night, PA-C 05/14/16 1610    Linwood Dibbles, MD 05/14/16 [phone]

## 2016-05-14 NOTE — ED Triage Notes (Signed)
Pt reports fever (high of 102.7) and sore throat since yesterday. Denies n/v/d, cold symptoms, cough.

## 2016-05-14 NOTE — Discharge Instructions (Signed)
Follow-up with your primary care doctor.  Return here as needed.  Increase your fluid intake and rest as much as possible °

## 2016-05-17 LAB — CULTURE, GROUP A STREP (THRC)

## 2016-06-16 ENCOUNTER — Ambulatory Visit: Payer: Managed Care, Other (non HMO) | Admitting: Neurology

## 2016-06-23 ENCOUNTER — Encounter: Payer: Self-pay | Admitting: Neurology

## 2017-01-31 ENCOUNTER — Emergency Department (HOSPITAL_BASED_OUTPATIENT_CLINIC_OR_DEPARTMENT_OTHER)
Admission: EM | Admit: 2017-01-31 | Discharge: 2017-01-31 | Disposition: A | Discharge status: Home / Self Care | Payer: BLUE CROSS/BLUE SHIELD | Attending: Emergency Medicine | Admitting: Emergency Medicine

## 2017-01-31 ENCOUNTER — Encounter (HOSPITAL_BASED_OUTPATIENT_CLINIC_OR_DEPARTMENT_OTHER): Payer: Self-pay | Admitting: *Deleted

## 2017-01-31 DIAGNOSIS — S0512XA Contusion of eyeball and orbital tissues, left eye, initial encounter: Secondary | ICD-10-CM | POA: Insufficient documentation

## 2017-01-31 DIAGNOSIS — Z87891 Personal history of nicotine dependence: Secondary | ICD-10-CM | POA: Diagnosis not present

## 2017-01-31 DIAGNOSIS — Y999 Unspecified external cause status: Secondary | ICD-10-CM | POA: Insufficient documentation

## 2017-01-31 DIAGNOSIS — Y939 Activity, unspecified: Secondary | ICD-10-CM | POA: Diagnosis not present

## 2017-01-31 DIAGNOSIS — H1132 Conjunctival hemorrhage, left eye: Secondary | ICD-10-CM

## 2017-01-31 DIAGNOSIS — W228XXA Striking against or struck by other objects, initial encounter: Secondary | ICD-10-CM | POA: Diagnosis not present

## 2017-01-31 DIAGNOSIS — Y929 Unspecified place or not applicable: Secondary | ICD-10-CM | POA: Insufficient documentation

## 2017-01-31 DIAGNOSIS — S0592XA Unspecified injury of left eye and orbit, initial encounter: Secondary | ICD-10-CM | POA: Diagnosis present

## 2017-01-31 MED ORDER — FLUORESCEIN SODIUM 0.6 MG OP STRP
1.0000 | ORAL_STRIP | Freq: Once | OPHTHALMIC | Status: AC
  Administered 2017-01-31: 1 via OPHTHALMIC

## 2017-01-31 MED ORDER — FLUORESCEIN SODIUM 0.6 MG OP STRP
ORAL_STRIP | OPHTHALMIC | Status: AC
  Administered 2017-01-31: 1 via OPHTHALMIC
  Filled 2017-01-31: qty 1

## 2017-01-31 MED ORDER — TETRACAINE HCL 0.5 % OP SOLN
2.0000 [drp] | Freq: Once | OPHTHALMIC | Status: AC
  Administered 2017-01-31: 2 [drp] via OPHTHALMIC

## 2017-01-31 MED ORDER — TETRACAINE HCL 0.5 % OP SOLN
OPHTHALMIC | Status: AC
  Administered 2017-01-31: 2 [drp] via OPHTHALMIC
  Filled 2017-01-31: qty 4

## 2017-01-31 NOTE — ED Provider Notes (Signed)
[facility]   CSN: [id number] Arrival date & time: 01/31/17  1715   By signing my name below, I, Freida Busman, attest that this documentation has been prepared under the direction and in the presence of Rolland Porter, MD . Electronically Signed: Freida Busman, Scribe. 01/31/2017. 5:38 PM.  History   Chief Complaint Chief Complaint  Patient presents with  . Eye Injury    The history is provided by the patient. No language interpreter was used.     HPI Comments:  Lindsey Walker is a 31 y.o. female who presents to the Emergency Department complaining of an injury to the left eye which she sustained today.  She states she accidentally bent over into the handle of a broom. She reports mild-moderate pain with associated redness  blurry vision to the left eye. No alleviating factors noted. Pt has no other acute complaints or injuries at this time.    Past Medical History:  Diagnosis Date  . Endometriosis   . Headache   . Pseudotumor cerebri 2016    Patient Active Problem List   Diagnosis Date Noted  . Idiopathic intracranial hypertension 12/21/2014    Past Surgical History:  Procedure Laterality Date  . ABLATION ON ENDOMETRIOSIS  2003    OB History    No data available       Home Medications    Prior to Admission medications   Medication Sig Start Date End Date Taking? Authorizing Provider  acetaminophen-codeine 120-12 MG/5ML solution Take 10 mLs by mouth every 4 (four) hours as needed for moderate pain. 05/14/16   Lawyer, Cristal Deer, PA-C  ibuprofen (ADVIL,MOTRIN) 200 MG tablet Take 200 mg by mouth every 6 (six) hours as needed for headache.    [provider]  rizatriptan (MAXALT-MLT) 10 MG disintegrating tablet Take 1tablet.  May repeat once in 2 hours if needed 03/14/16   Drema Dallas, DO    Family History Family History  Problem Relation Age of Onset  . Adopted: Yes  . Thyroid disease Mother   . Parkinson's disease  Maternal Grandfather   . Thyroid disease Maternal Grandmother   . Hypertension Maternal Grandmother   . Cancer Maternal Grandmother        bladder    Social History Social History  Substance Use Topics  . Smoking status: Former Games developer  . Smokeless tobacco: Never Used  . Alcohol use 0.0 oz/week     Comment: occasionally throughout year     Allergies   Sulfa antibiotics   Review of Systems Review of Systems  Constitutional: Negative for appetite change, chills, diaphoresis, fatigue and fever.  HENT: Negative for mouth sores, sore throat and trouble swallowing.   Eyes: Positive for pain, redness and visual disturbance.  Respiratory: Negative for cough, chest tightness, shortness of breath and wheezing.   Cardiovascular: Negative for chest pain.  Gastrointestinal: Negative for abdominal distention, abdominal pain, diarrhea, nausea and vomiting.  Endocrine: Negative for polydipsia, polyphagia and polyuria.  Genitourinary: Negative for dysuria, frequency and hematuria.  Musculoskeletal: Negative for gait problem.  Skin: Negative for color change, pallor and rash.  Neurological: Negative for dizziness, syncope, light-headedness and headaches.  Hematological: Does not bruise/bleed easily.  Psychiatric/Behavioral: Negative for behavioral problems and confusion.     Physical Exam Updated Vital Signs BP 126/84   Pulse 83   Temp 98.6 F (37 C)   Resp 16   Ht 5\' 7"  (1.702 m)   Wt 127 kg (280 lb)   LMP 12/20/2016  SpO2 98%   BMI 43.85 kg/m   Physical Exam  Constitutional: She is oriented to person, place, and time. She appears well-developed and well-nourished. No distress.  HENT:  Head: Normocephalic.  Eyes: No scleral icterus.  Blepharospasm of the left eye 1-2 mm subconjunctival hemorrhage on sclera at 2 o'clock position No hyphema  Pupil 4mm round and reactive in the left eye    Neck: Normal range of motion. Neck supple. No thyromegaly present.  Cardiovascular:  Normal rate and regular rhythm.  Exam reveals no gallop and no friction rub.   No murmur heard. Pulmonary/Chest: Effort normal and breath sounds normal. No respiratory distress. She has no wheezes. She has no rales.  Abdominal: Soft. Bowel sounds are normal. She exhibits no distension. There is no tenderness. There is no rebound.  Musculoskeletal: Normal range of motion.  Neurological: She is alert and oriented to person, place, and time.  Skin: Skin is warm and dry. No rash noted.  Psychiatric: She has a normal mood and affect. Her behavior is normal.  Nursing note and vitals reviewed.    ED Treatments / Results  DIAGNOSTIC STUDIES:  Oxygen Saturation is 98% on RA, normal by my interpretation.    COORDINATION OF CARE:  5:38 PM Discussed treatment plan with pt at bedside and pt agreed to plan.  Labs (all labs ordered are listed, but only abnormal results are displayed) Labs Reviewed - No data to display  EKG  EKG Interpretation None       Radiology No results found.  Procedures Procedures (including critical care time)  Medications Ordered in ED Medications  tetracaine (PONTOCAINE) 0.5 % ophthalmic solution 2 drop (not administered)  fluorescein ophthalmic strip 1 strip (not administered)     Initial Impression / Assessment and Plan / ED Course  I have reviewed the triage vital signs and the nursing notes.  Pertinent labs & imaging results that were available during my care of the patient were reviewed by me and considered in my medical decision making (see chart for details).     Normal retina/posterior chamber with HH Opthalmoscope. Normal EO Movement. Scleral abrasion via slit lamp. No hyphema. Woods lamp with scleral abrasion, but no corneal uptake. Pupil round. No sign of open globe. VA 20/30 OD20/50 OS--Pt states normal OS vision is 20/40 No visual field cuts to confrontation.  Final Clinical Impressions(s) / ED Diagnoses   Final diagnoses:    Contusion of left eye, initial encounter  Subconjunctival hemorrhage of left eye   Pt Dc'd. See her Optho (Dr. Cathey EndowBowen in 2-3 days if pain, blurring persist.  New Prescriptions New Prescriptions   No medications on file   I personally performed the services described in this documentation, which was scribed in my presence. The recorded information has been reviewed and is accurate.     Rolland PorterJames, Dylynn Ketner, MD 01/31/17 [phone]

## 2017-01-31 NOTE — Discharge Instructions (Signed)
See your opthalmologist in 48 hours if pain and blurry vision have not resolved. Avoid prolonged driving, TV or computer screens.

## 2017-01-31 NOTE — ED Triage Notes (Signed)
Pt c/o left eye injury x 2 hrs ago

## 2017-06-14 LAB — OB RESULTS CONSOLE RUBELLA ANTIBODY, IGM: RUBELLA: IMMUNE

## 2017-06-14 LAB — OB RESULTS CONSOLE RPR: RPR: NONREACTIVE

## 2017-06-14 LAB — OB RESULTS CONSOLE HEPATITIS B SURFACE ANTIGEN: Hepatitis B Surface Ag: NEGATIVE

## 2017-06-14 LAB — OB RESULTS CONSOLE ANTIBODY SCREEN: ANTIBODY SCREEN: NEGATIVE

## 2017-06-14 LAB — OB RESULTS CONSOLE HIV ANTIBODY (ROUTINE TESTING): HIV: NONREACTIVE

## 2017-06-14 LAB — OB RESULTS CONSOLE GC/CHLAMYDIA
Chlamydia: NEGATIVE
Gonorrhea: NEGATIVE

## 2017-06-14 LAB — OB RESULTS CONSOLE ABO/RH: RH TYPE: POSITIVE

## 2017-12-20 LAB — OB RESULTS CONSOLE GBS: GBS: NEGATIVE

## 2018-01-03 ENCOUNTER — Inpatient Hospital Stay (HOSPITAL_COMMUNITY)
Admission: AD | Admit: 2018-01-03 | Discharge: 2018-01-03 | Disposition: A | Discharge status: Home / Self Care | Payer: BLUE CROSS/BLUE SHIELD | Source: Ambulatory Visit | Attending: Obstetrics and Gynecology | Admitting: Obstetrics and Gynecology

## 2018-01-03 ENCOUNTER — Encounter (HOSPITAL_COMMUNITY): Payer: Self-pay | Admitting: *Deleted

## 2018-01-03 DIAGNOSIS — Z3A38 38 weeks gestation of pregnancy: Secondary | ICD-10-CM | POA: Diagnosis not present

## 2018-01-03 DIAGNOSIS — O26893 Other specified pregnancy related conditions, third trimester: Secondary | ICD-10-CM | POA: Diagnosis not present

## 2018-01-03 DIAGNOSIS — Z87891 Personal history of nicotine dependence: Secondary | ICD-10-CM | POA: Diagnosis not present

## 2018-01-03 DIAGNOSIS — O10913 Unspecified pre-existing hypertension complicating pregnancy, third trimester: Secondary | ICD-10-CM | POA: Insufficient documentation

## 2018-01-03 DIAGNOSIS — R51 Headache: Secondary | ICD-10-CM | POA: Insufficient documentation

## 2018-01-03 DIAGNOSIS — Z79899 Other long term (current) drug therapy: Secondary | ICD-10-CM | POA: Insufficient documentation

## 2018-01-03 DIAGNOSIS — O163 Unspecified maternal hypertension, third trimester: Secondary | ICD-10-CM

## 2018-01-03 DIAGNOSIS — Z881 Allergy status to other antibiotic agents status: Secondary | ICD-10-CM | POA: Insufficient documentation

## 2018-01-03 LAB — CBC
HCT: 32.1 % — ABNORMAL LOW (ref 36.0–46.0)
Hemoglobin: 10 g/dL — ABNORMAL LOW (ref 12.0–15.0)
MCH: 22.8 pg — ABNORMAL LOW (ref 26.0–34.0)
MCHC: 31.2 g/dL (ref 30.0–36.0)
MCV: 73.1 fL — ABNORMAL LOW (ref 78.0–100.0)
PLATELETS: 307 10*3/uL (ref 150–400)
RBC: 4.39 MIL/uL (ref 3.87–5.11)
RDW: 15.7 % — AB (ref 11.5–15.5)
WBC: 13.1 10*3/uL — AB (ref 4.0–10.5)

## 2018-01-03 LAB — PROTEIN / CREATININE RATIO, URINE
Creatinine, Urine: 184 mg/dL
Protein Creatinine Ratio: 0.18 mg/mg{Cre} — ABNORMAL HIGH (ref 0.00–0.15)
Total Protein, Urine: 33 mg/dL

## 2018-01-03 LAB — COMPREHENSIVE METABOLIC PANEL
ALBUMIN: 2.5 g/dL — AB (ref 3.5–5.0)
ALT: 14 U/L (ref 14–54)
ANION GAP: 11 (ref 5–15)
AST: 16 U/L (ref 15–41)
Alkaline Phosphatase: 139 U/L — ABNORMAL HIGH (ref 38–126)
BUN: 7 mg/dL (ref 6–20)
CHLORIDE: 105 mmol/L (ref 101–111)
CO2: 22 mmol/L (ref 22–32)
Calcium: 8.8 mg/dL — ABNORMAL LOW (ref 8.9–10.3)
Creatinine, Ser: 0.58 mg/dL (ref 0.44–1.00)
GFR calc Af Amer: >60 mL/min (ref >60)
GLUCOSE: 99 mg/dL (ref 65–99)
Potassium: 3.8 mmol/L (ref 3.5–5.1)
Sodium: 138 mmol/L (ref 135–145)
Total Bilirubin: 0.4 mg/dL (ref 0.3–1.2)
Total Protein: 6.1 g/dL — ABNORMAL LOW (ref 6.5–8.1)

## 2018-01-03 LAB — URINALYSIS, ROUTINE W REFLEX MICROSCOPIC
BILIRUBIN URINE: NEGATIVE
Glucose, UA: NEGATIVE mg/dL
HGB URINE DIPSTICK: NEGATIVE
Ketones, ur: NEGATIVE mg/dL
Nitrite: NEGATIVE
PROTEIN: 30 mg/dL — AB
Specific Gravity, Urine: 1.019 (ref 1.005–1.030)
pH: 5 (ref 5.0–8.0)

## 2018-01-03 MED ORDER — BUTALBITAL-APAP-CAFFEINE 50-325-40 MG PO TABS
2.0000 | ORAL_TABLET | Freq: Once | ORAL | Status: DC
  Filled 2018-01-03: qty 2

## 2018-01-03 MED ORDER — BUTALBITAL-APAP-CAFFEINE 50-325-40 MG PO TABS
1.0000 | ORAL_TABLET | Freq: Once | ORAL | Status: AC
  Administered 2018-01-03: 1 via ORAL
  Filled 2018-01-03: qty 1

## 2018-01-03 NOTE — MAU Provider Note (Signed)
Patient  Lindsey Walker is a 32 y.o. G2P1001 At [redacted]w[redacted]d here after an office visit today when she was found to have high blood pressure and also complained of a dull headache. She did not complaint of the headache initially but when asked if she had a HA, she said yes.    She feels strong fetal movements; no bleeding, leaking of fluid. She has not had any other complications in this pregnancy.  History     CSN: [id number]  Arrival date and time: 01/03/18 1805   First Provider Initiated Contact with Patient 01/03/18 1934      Chief Complaint  Patient presents with  . Hypertension   Hypertension  This is a new problem. The current episode started today. The problem is unchanged. Associated symptoms include headaches. Pertinent negatives include no blurred vision or chest pain.  Headache   This is a new problem. The current episode started today. The problem occurs constantly. The problem has been unchanged. The pain is located in the frontal region. The pain does not radiate. Pertinent negatives include no blurred vision, numbness, visual change or vomiting. Her past medical history is significant for hypertension.  She says that the last few visits her blood pressure has been elevated at her ob-gyn visits.  Blood pressures in the office were 142/98 (today).  She has not been told she has gestational hypertension and she has not had any extra antenatal testing.   OB History    Gravida  2   Para  1   Term  1   Preterm      AB      Living  1     SAB      TAB      Ectopic      Multiple      Live Births              Past Medical History:  Diagnosis Date  . Endometriosis   . Headache   . Pseudotumor cerebri 2016    Past Surgical History:  Procedure Laterality Date  . ABLATION ON ENDOMETRIOSIS  2003    Family History  Adopted: Yes  Problem Relation Age of Onset  . Thyroid disease Mother   . Parkinson's disease Maternal Grandfather   . Thyroid disease  Maternal Grandmother   . Hypertension Maternal Grandmother   . Cancer Maternal Grandmother        bladder    Social History   Tobacco Use  . Smoking status: Former Games developer  . Smokeless tobacco: Never Used  Substance Use Topics  . Alcohol use: Yes    Alcohol/week: 0.0 oz    Comment: occasionally throughout year  . Drug use: No    Allergies:  Allergies  Allergen Reactions  . Sulfa Antibiotics     Medications Prior to Admission  Medication Sig Dispense Refill Last Dose  . acetaminophen-codeine 120-12 MG/5ML solution Take 10 mLs by mouth every 4 (four) hours as needed for moderate pain. 120 mL 0   . ibuprofen (ADVIL,MOTRIN) 200 MG tablet Take 200 mg by mouth every 6 (six) hours as needed for headache.   Taking  . rizatriptan (MAXALT-MLT) 10 MG disintegrating tablet Take 1tablet.  May repeat once in 2 hours if needed 9 tablet 3     Review of Systems  Constitutional: Negative.   HENT: Negative.   Eyes: Negative for blurred vision.  Cardiovascular: Negative for chest pain.  Gastrointestinal: Negative.  Negative for vomiting.  Genitourinary: Negative.  Musculoskeletal: Negative.   Neurological: Positive for headaches. Negative for numbness.  Hematological: Negative.    Physical Exam   Blood pressure 127/76, pulse 98, temperature 99.1 F (37.3 C), resp. rate 18, height  (1.702 m), weight (!) 328 lb (148.8 kg).  Physical Exam  Constitutional: She appears well-developed.  HENT:  Head: Normocephalic.  Neck: Normal range of motion.  Respiratory: Effort normal. No respiratory distress. She has no wheezes. She has no rales. She exhibits no tenderness.  GI: Soft.  Neurological: She is alert.  Skin: Skin is warm and dry.  Psychiatric: She has a normal mood and affect.    MAU Course  Procedures  MDM -NST: 150 bpm, mod var, present acel, neg decel -CBC and CMP normal, PCR normal 1 tab of Fioricet for pain Patient had one one elevated pressure while in MAU,  otherwise normal.  Patient's labs, NST, physical exam discussed with Dr. Vincente Poli. Dr. Vincente Poli recommends discharge; patient to call office in the morning to set up BP check on Monday.     Assessment and Plan   1. High blood pressure affecting pregnancy in third trimester, antepartum    2. Patient stable for discharge with pre-e warnings signs reviewed.   3. Patient to call office in morning for BP check appointment on Monday.   4. Return to MAU if she develops warning signs of pregnancy/pre-e.   5. All questions answered by patient and partner.   Charlesetta Garibaldi Demetrick Eichenberger 01/03/2018, 7:34 PM

## 2018-01-03 NOTE — Discharge Instructions (Signed)
Preeclampsia and Eclampsia °Preeclampsia is a serious condition that develops only during pregnancy. It is also called toxemia of pregnancy. This condition causes high blood pressure along with other symptoms, such as swelling and headaches. These symptoms may develop as the condition gets worse. Preeclampsia may occur at 20 weeks of pregnancy or later. °Diagnosing and treating preeclampsia early is very important. If not treated early, it can cause serious problems for you and your baby. One problem it can lead to is eclampsia, which is a condition that causes muscle jerking or shaking (convulsions or seizures) in the mother. Delivering your baby is the best treatment for preeclampsia or eclampsia. Preeclampsia and eclampsia symptoms usually go away after your baby is born. °What are the causes? °The cause of preeclampsia is not known. °What increases the risk? °The following risk factors make you more likely to develop preeclampsia: °· Being pregnant for the first time. °· Having had preeclampsia during a past pregnancy. °· Having a family history of preeclampsia. °· Having high blood pressure. °· Being pregnant with twins or triplets. °· Being 35 or older. °· Being African-American. °· Having kidney disease or diabetes. °· Having medical conditions such as lupus or blood diseases. °· Being very overweight (obese). ° °What are the signs or symptoms? °The earliest signs of preeclampsia are: °· High blood pressure. °· Increased protein in your urine. Your health care provider will check for this at every visit before you give birth (prenatal visit). ° °Other symptoms that may develop as the condition gets worse include: °· Severe headaches. °· Sudden weight gain. °· Swelling of the hands, face, legs, and feet. °· Nausea and vomiting. °· Vision problems, such as blurred or double vision. °· Numbness in the face, arms, legs, and feet. °· Urinating less than usual. °· Dizziness. °· Slurred speech. °· Abdominal pain,  especially upper abdominal pain. °· Convulsions or seizures. ° °Symptoms generally go away after giving birth. °How is this diagnosed? °There are no screening tests for preeclampsia. Your health care provider will ask you about symptoms and check for signs of preeclampsia during your prenatal visits. You may also have tests that include: °· Urine tests. °· Blood tests. °· Checking your blood pressure. °· Monitoring your baby’s heart rate. °· Ultrasound. ° °How is this treated? °You and your health care provider will determine the treatment approach that is best for you. Treatment may include: °· Having more frequent prenatal exams to check for signs of preeclampsia, if you have an increased risk for preeclampsia. °· Bed rest. °· Reducing how much salt (sodium) you eat. °· Medicine to lower your blood pressure. °· Staying in the hospital, if your condition is severe. There, treatment will focus on controlling your blood pressure and the amount of fluids in your body (fluid retention). °· You may need to take medicine (magnesium sulfate) to prevent seizures. This medicine may be given as an injection or through an IV tube. °· Delivering your baby early, if your condition gets worse. You may have your labor started with medicine (induced), or you may have a cesarean delivery. ° °Follow these instructions at home: °Eating and drinking ° °· Drink enough fluid to keep your urine clear or pale yellow. °· Eat a healthy diet that is low in sodium. Do not add salt to your food. Check nutrition labels to see how much sodium a food or beverage contains. °· Avoid caffeine. °Lifestyle °· Do not use any products that contain nicotine or tobacco, such as cigarettes   and e-cigarettes. If you need help quitting, ask your health care provider. °· Do not use alcohol or drugs. °· Avoid stress as much as possible. Rest and get plenty of sleep. °General instructions °· Take over-the-counter and prescription medicines only as told by your  health care provider. °· When lying down, lie on your side. This keeps pressure off of your baby. °· When sitting or lying down, raise (elevate) your feet. Try putting some pillows underneath your lower legs. °· Exercise regularly. Ask your health care provider what kinds of exercise are best for you. °· Keep all follow-up and prenatal visits as told by your health care provider. This is important. °How is this prevented? °To prevent preeclampsia or eclampsia from developing during another pregnancy: °· Get proper medical care during pregnancy. Your health care provider may be able to prevent preeclampsia or diagnose and treat it early. °· Your health care provider may have you take a low-dose aspirin or a calcium supplement during your next pregnancy. °· You may have tests of your blood pressure and kidney function after giving birth. °· Maintain a healthy weight. Ask your health care provider for help managing weight gain during pregnancy. °· Work with your health care provider to manage any long-term (chronic) health conditions you have, such as diabetes or kidney problems. ° °Contact a health care provider if: °· You gain more weight than expected. °· You have headaches. °· You have nausea or vomiting. °· You have abdominal pain. °· You feel dizzy or light-headed. °Get help right away if: °· You develop sudden or severe swelling anywhere in your body. This usually happens in the legs. °· You gain 5 lbs (2.3 kg) or more during one week. °· You have severe: °? Abdominal pain. °? Headaches. °? Dizziness. °? Vision problems. °? Confusion. °? Nausea or vomiting. °· You have a seizure. °· You have trouble moving any part of your body. °· You develop numbness in any part of your body. °· You have trouble speaking. °· You have any abnormal bleeding. °· You pass out. °This information is not intended to replace advice given to you by your health care provider. Make sure you discuss any questions you have with your health  care provider. °Document Released: 07/21/2000 Document Revised: 03/21/2016 Document Reviewed: 02/28/2016 °Elsevier Interactive Patient Education © 2018 Else[employer] Inc. ° °

## 2018-01-03 NOTE — MAU Note (Signed)
Sent from office for elevated b/p. 142/88 reports dull headache denies any visual changes. Edema in feet and hands. Good fetal movement reported

## 2018-01-07 ENCOUNTER — Telehealth (HOSPITAL_COMMUNITY): Payer: Self-pay | Admitting: *Deleted

## 2018-01-07 ENCOUNTER — Encounter (HOSPITAL_COMMUNITY): Payer: Self-pay | Admitting: *Deleted

## 2018-01-07 NOTE — Telephone Encounter (Signed)
Preadmission screen  

## 2018-01-07 NOTE — Telephone Encounter (Signed)
Preadmission screen Notified Dr Krista BlueSinger of Induction scheduled and he requested a recommendation for management from Dr Everlena CooperJaffe via [employer] messaging.  I attempted to call pt and was unable to reach her.  Left a message and also notified office of plan .

## 2018-01-08 ENCOUNTER — Telehealth (HOSPITAL_COMMUNITY): Payer: Self-pay | Admitting: *Deleted

## 2018-01-08 NOTE — Telephone Encounter (Signed)
No response noted from Dr Everlena CooperJaffe yet.  Attempted to call office.  Unable to reach a person on the line.

## 2018-01-09 ENCOUNTER — Telehealth (HOSPITAL_COMMUNITY): Payer: Self-pay | Admitting: *Deleted

## 2018-01-09 NOTE — Progress Notes (Signed)
    1  Lindsey Walker Female, 31 y.o., 04/15/1986 MRN:  [id number] Phone:  [phone] (H) PCP:  Joycelyn RuaMeyers, Stephen, MD Coverage:  [employer]yndee [employer]UE CROSS BLUE [employer]IELD/BCBS OTHER FW: idiopathic intracranial hypertension  Received: Yesterday  Message Contents  Heather RobertsSinger, Meshell Abdulaziz, MD sent to Ardelia MemsLandon, Amelia L, RN        FYI. She needs an eye exam.   Previous Messages    ----- Message -----  From: Drema DallasJaffe, Adam R, DO  Sent: 01/08/2018 10:38 AM  To: Heather RobertsJames Roddie Riegler, MD  Subject: RE: idiopathic intracranial hypertension     Hi Dan,   Spinal anesthesia is safe. I have not seen her since August 2017, so she should get another eye exam and follow up with me if she still exhibits signs of papilledema.   Adam  ----- Message -----  From: Heather RobertsSinger, Zofia Peckinpaugh, MD  Sent: 01/07/2018  3:15 PM  To: Ardelia MemsAmelia L Landon, RN, Richarda Overlieichard Holland, MD, *  Subject: idiopathic intracranial hypertension       Dr. Everlena CooperJaffe,  I'm contacting you for any recommendations or restriction for the care of Lindsey Walker. She has been scheduled for induction of labor on 01/10/2018. She has received an epidural in the past (at an outside hospital) without problems (several years ago). We would like your input in regards to the appropriateness of performing epidural and/or spinal anesthesia given her diagnosis of idiopathic intracranial hypertension during her course of labor and possible operative delivery. If you feel she needs further w/u for her idiopathic intracranial hypertension we will notify her. We have informed her that we will be seeking your input.  Thanks you for your help,  Adonis Hugueninan Hyder Deman, MD  Anesthesiology

## 2018-01-10 ENCOUNTER — Inpatient Hospital Stay (HOSPITAL_COMMUNITY): Payer: BLUE CROSS/BLUE SHIELD | Admitting: Anesthesiology

## 2018-01-10 ENCOUNTER — Encounter (HOSPITAL_COMMUNITY): Payer: Self-pay

## 2018-01-10 ENCOUNTER — Inpatient Hospital Stay (HOSPITAL_COMMUNITY)
Admission: RE | Admit: 2018-01-10 | Discharge: 2018-01-11 | DRG: 807 | Disposition: A | Discharge status: Home / Self Care | Payer: BLUE CROSS/BLUE SHIELD | Source: Ambulatory Visit | Attending: Obstetrics and Gynecology | Admitting: Obstetrics and Gynecology

## 2018-01-10 DIAGNOSIS — O99214 Obesity complicating childbirth: Secondary | ICD-10-CM | POA: Diagnosis present

## 2018-01-10 DIAGNOSIS — O9902 Anemia complicating childbirth: Secondary | ICD-10-CM | POA: Diagnosis present

## 2018-01-10 DIAGNOSIS — Z87891 Personal history of nicotine dependence: Secondary | ICD-10-CM

## 2018-01-10 DIAGNOSIS — O139 Gestational [pregnancy-induced] hypertension without significant proteinuria, unspecified trimester: Secondary | ICD-10-CM | POA: Diagnosis present

## 2018-01-10 DIAGNOSIS — O134 Gestational [pregnancy-induced] hypertension without significant proteinuria, complicating childbirth: Principal | ICD-10-CM | POA: Diagnosis present

## 2018-01-10 DIAGNOSIS — D649 Anemia, unspecified: Secondary | ICD-10-CM | POA: Diagnosis present

## 2018-01-10 DIAGNOSIS — Z3A39 39 weeks gestation of pregnancy: Secondary | ICD-10-CM

## 2018-01-10 LAB — CBC
HCT: 31 % — ABNORMAL LOW (ref 36.0–46.0)
HCT: 31 % — ABNORMAL LOW (ref 36.0–46.0)
HCT: 30.2 % — ABNORMAL LOW (ref 36.0–46.0)
HEMOGLOBIN: 9.6 g/dL — AB (ref 12.0–15.0)
Hemoglobin: 9.9 g/dL — ABNORMAL LOW (ref 12.0–15.0)
Hemoglobin: 9.7 g/dL — ABNORMAL LOW (ref 12.0–15.0)
MCH: 22.8 pg — ABNORMAL LOW (ref 26.0–34.0)
MCH: 22.5 pg — ABNORMAL LOW (ref 26.0–34.0)
MCH: 22.8 pg — AB (ref 26.0–34.0)
MCHC: 31.9 g/dL (ref 30.0–36.0)
MCHC: 31.3 g/dL (ref 30.0–36.0)
MCHC: 31.8 g/dL (ref 30.0–36.0)
MCV: 71.4 fL — ABNORMAL LOW (ref 78.0–100.0)
MCV: 71.8 fL — AB (ref 78.0–100.0)
MCV: 71.7 fL — ABNORMAL LOW (ref 78.0–100.0)
PLATELETS: 307 10*3/uL (ref 150–400)
PLATELETS: 285 10*3/uL (ref 150–400)
PLATELETS: 293 10*3/uL (ref 150–400)
RBC: 4.34 MIL/uL (ref 3.87–5.11)
RBC: 4.32 MIL/uL (ref 3.87–5.11)
RBC: 4.21 MIL/uL (ref 3.87–5.11)
RDW: 15.8 % — ABNORMAL HIGH (ref 11.5–15.5)
RDW: 15.8 % — ABNORMAL HIGH (ref 11.5–15.5)
RDW: 15.7 % — ABNORMAL HIGH (ref 11.5–15.5)
WBC: 12.3 10*3/uL — ABNORMAL HIGH (ref 4.0–10.5)
WBC: 11.9 10*3/uL — AB (ref 4.0–10.5)
WBC: 19.1 10*3/uL — ABNORMAL HIGH (ref 4.0–10.5)

## 2018-01-10 LAB — TYPE AND SCREEN
ABO/RH(D): B POS
Antibody Screen: NEGATIVE

## 2018-01-10 LAB — RPR: RPR Ser Ql: NONREACTIVE

## 2018-01-10 LAB — ABO/RH: ABO/RH(D): B POS

## 2018-01-10 MED ORDER — EPHEDRINE 5 MG/ML INJ: 10.0000 mg | INTRAVENOUS | Status: DC | PRN

## 2018-01-10 MED ORDER — MEASLES, MUMPS & RUBELLA VAC ~~LOC~~ INJ
0.5000 mL | INJECTION | Freq: Once | SUBCUTANEOUS | Status: DC
  Filled 2018-01-10: qty 0.5

## 2018-01-10 MED ORDER — SOD CITRATE-CITRIC ACID 500-334 MG/5ML PO SOLN: 30.0000 mL | ORAL | Status: DC | PRN

## 2018-01-10 MED ORDER — ONDANSETRON HCL 4 MG PO TABS: 4.0000 mg | ORAL_TABLET | ORAL | Status: DC | PRN

## 2018-01-10 MED ORDER — LIDOCAINE HCL (PF) 1 % IJ SOLN
INTRAMUSCULAR | Status: DC | PRN
  Administered 2018-01-10: 3 mL via EPIDURAL
  Administered 2018-01-10: 5 mL via EPIDURAL
  Administered 2018-01-10: 2 mL via EPIDURAL

## 2018-01-10 MED ORDER — TERBUTALINE SULFATE 1 MG/ML IJ SOLN
0.2500 mg | Freq: Once | INTRAMUSCULAR | Status: DC | PRN
  Filled 2018-01-10: qty 1

## 2018-01-10 MED ORDER — LIDOCAINE HCL (PF) 1 % IJ SOLN
30.0000 mL | INTRAMUSCULAR | Status: DC | PRN
  Filled 2018-01-10: qty 30

## 2018-01-10 MED ORDER — DIPHENHYDRAMINE HCL 50 MG/ML IJ SOLN: 12.5000 mg | INTRAMUSCULAR | Status: DC | PRN

## 2018-01-10 MED ORDER — COCONUT OIL OIL: 1.0000 "application " | TOPICAL_OIL | Status: DC | PRN

## 2018-01-10 MED ORDER — MISOPROSTOL 25 MCG QUARTER TABLET
25.0000 ug | ORAL_TABLET | ORAL | Status: DC | PRN
  Administered 2018-01-10 (×2): 25 ug via VAGINAL
  Filled 2018-01-10 (×3): qty 1

## 2018-01-10 MED ORDER — PHENYLEPHRINE 40 MCG/ML (10ML) SYRINGE FOR IV PUSH (FOR BLOOD PRESSURE SUPPORT)
80.0000 ug | PREFILLED_SYRINGE | INTRAVENOUS | Status: DC | PRN
  Filled 2018-01-10: qty 5

## 2018-01-10 MED ORDER — PRENATAL MULTIVITAMIN CH
1.0000 | ORAL_TABLET | Freq: Every day | ORAL | Status: DC
  Administered 2018-01-11: 1 via ORAL
  Filled 2018-01-10: qty 1

## 2018-01-10 MED ORDER — ONDANSETRON HCL 4 MG/2ML IJ SOLN: 4.0000 mg | Freq: Four times a day (QID) | INTRAMUSCULAR | Status: DC | PRN

## 2018-01-10 MED ORDER — OXYTOCIN 40 UNITS IN LACTATED RINGERS INFUSION - SIMPLE MED
2.5000 [IU]/h | INTRAVENOUS | Status: DC
  Administered 2018-01-10: 2.5 [IU]/h via INTRAVENOUS

## 2018-01-10 MED ORDER — EPHEDRINE 5 MG/ML INJ
10.0000 mg | INTRAVENOUS | Status: DC | PRN
  Filled 2018-01-10: qty 2

## 2018-01-10 MED ORDER — BENZOCAINE-MENTHOL 20-0.5 % EX AERO: 1.0000 "application " | INHALATION_SPRAY | CUTANEOUS | Status: DC | PRN

## 2018-01-10 MED ORDER — OXYCODONE-ACETAMINOPHEN 5-325 MG PO TABS: 2.0000 | ORAL_TABLET | ORAL | Status: DC | PRN

## 2018-01-10 MED ORDER — SIMETHICONE 80 MG PO CHEW
80.0000 mg | CHEWABLE_TABLET | ORAL | Status: DC | PRN
  Administered 2018-01-11: 80 mg via ORAL
  Filled 2018-01-10: qty 1

## 2018-01-10 MED ORDER — ONDANSETRON HCL 4 MG/2ML IJ SOLN: 4.0000 mg | INTRAMUSCULAR | Status: DC | PRN

## 2018-01-10 MED ORDER — SENNOSIDES-DOCUSATE SODIUM 8.6-50 MG PO TABS
2.0000 | ORAL_TABLET | ORAL | Status: DC
  Administered 2018-01-11: 2 via ORAL
  Filled 2018-01-10: qty 2

## 2018-01-10 MED ORDER — DIPHENHYDRAMINE HCL 25 MG PO CAPS: 25.0000 mg | ORAL_CAPSULE | Freq: Four times a day (QID) | ORAL | Status: DC | PRN

## 2018-01-10 MED ORDER — PHENYLEPHRINE 40 MCG/ML (10ML) SYRINGE FOR IV PUSH (FOR BLOOD PRESSURE SUPPORT)
80.0000 ug | PREFILLED_SYRINGE | INTRAVENOUS | Status: DC | PRN
  Filled 2018-01-10: qty 5
  Filled 2018-01-10: qty 10

## 2018-01-10 MED ORDER — DIBUCAINE 1 % RE OINT: 1.0000 "application " | TOPICAL_OINTMENT | RECTAL | Status: DC | PRN

## 2018-01-10 MED ORDER — ACETAMINOPHEN 325 MG PO TABS: 650.0000 mg | ORAL_TABLET | ORAL | Status: DC | PRN

## 2018-01-10 MED ORDER — WITCH HAZEL-GLYCERIN EX PADS: 1.0000 "application " | MEDICATED_PAD | CUTANEOUS | Status: DC | PRN

## 2018-01-10 MED ORDER — TETANUS-DIPHTH-ACELL PERTUSSIS 5-2.5-18.5 LF-MCG/0.5 IM SUSP: 0.5000 mL | Freq: Once | INTRAMUSCULAR | Status: DC

## 2018-01-10 MED ORDER — OXYCODONE-ACETAMINOPHEN 5-325 MG PO TABS: 1.0000 | ORAL_TABLET | ORAL | Status: DC | PRN

## 2018-01-10 MED ORDER — MEDROXYPROGESTERONE ACETATE 150 MG/ML IM SUSP: 150.0000 mg | INTRAMUSCULAR | Status: DC | PRN

## 2018-01-10 MED ORDER — FENTANYL 2.5 MCG/ML BUPIVACAINE 1/10 % EPIDURAL INFUSION (WH - ANES)
14.0000 mL/h | INTRAMUSCULAR | Status: DC | PRN
  Administered 2018-01-10: 14 mL/h via EPIDURAL
  Administered 2018-01-10: 12 mL/h via EPIDURAL
  Administered 2018-01-10: 14 mL/h via EPIDURAL
  Filled 2018-01-10 (×2): qty 100

## 2018-01-10 MED ORDER — IBUPROFEN 600 MG PO TABS
600.0000 mg | ORAL_TABLET | Freq: Four times a day (QID) | ORAL | Status: DC
  Administered 2018-01-11 (×3): 600 mg via ORAL
  Filled 2018-01-10 (×3): qty 1

## 2018-01-10 MED ORDER — LACTATED RINGERS IV SOLN: 500.0000 mL | INTRAVENOUS | Status: DC | PRN

## 2018-01-10 MED ORDER — OXYTOCIN 40 UNITS IN LACTATED RINGERS INFUSION - SIMPLE MED
1.0000 m[IU]/min | INTRAVENOUS | Status: DC
  Administered 2018-01-10: 2 m[IU]/min via INTRAVENOUS
  Filled 2018-01-10: qty 1000

## 2018-01-10 MED ORDER — PHENYLEPHRINE 40 MCG/ML (10ML) SYRINGE FOR IV PUSH (FOR BLOOD PRESSURE SUPPORT): 80.0000 ug | PREFILLED_SYRINGE | INTRAVENOUS | Status: DC | PRN

## 2018-01-10 MED ORDER — LACTATED RINGERS IV SOLN: 500.0000 mL | Freq: Once | INTRAVENOUS | Status: DC

## 2018-01-10 MED ORDER — OXYTOCIN BOLUS FROM INFUSION
500.0000 mL | Freq: Once | INTRAVENOUS | Status: AC
  Administered 2018-01-10: 500 mL via INTRAVENOUS

## 2018-01-10 MED ORDER — LACTATED RINGERS IV SOLN
INTRAVENOUS | Status: DC
  Administered 2018-01-10 (×3): via INTRAVENOUS

## 2018-01-10 NOTE — H&P (Signed)
Lindsey Walker is a 32 y.o. female presenting for IOL.  [facility]  BP 140s/80-90s.  No HA, n/v or visual changes.  Good FM.  S/p cytotec overnight  OB History    Gravida  2   Para  1   Term  1   Preterm      AB      Living  1     SAB      TAB      Ectopic      Multiple      Live Births  1          Past Medical History:  Diagnosis Date  . Endometriosis   . Headache   . Hypertension    PIH  . Pseudotumor cerebri 2016  . Vaginal Pap smear, abnormal    Past Surgical History:  Procedure Laterality Date  . ABLATION ON ENDOMETRIOSIS  2003  . COLPOSCOPY     Family History: family history includes Atrial fibrillation in her maternal grandmother; Cancer in her maternal grandmother; Hypertension in her maternal grandmother; Parkinson's disease in her maternal grandfather; Thyroid disease in her maternal grandmother and mother. She was adopted. Social History:  reports that she has quit smoking. She has never used smokeless tobacco. She reports that she drinks alcohol. She reports that she does not use drugs.     Maternal Diabetes: No Genetic Screening: Declined Maternal Ultrasounds/Referrals: Normal Fetal Ultrasounds or other Referrals:  None Maternal Substance Abuse:  No Significant Maternal Medications:  None Significant Maternal Lab Results:  None Other Comments:  None  ROS History Dilation: 3 Effacement (%): 50 Station: -3 Exam by:: Dr. Renaldo FiddlerAdkins Blood pressure 136/80, pulse 77, temperature 98.4 F (36.9 C), temperature source Oral, resp. rate 18, weight (!) 330 lb 9.6 oz (150 kg). Exam Physical Exam  Gen - NAD Abd - gravid, NT  EFW 7.5-8# Ext - NT Cvx 3/50/-2 AROM - clear  Prenatal labs: ABO, Rh: --/--/B POS, B POS Performed at [facility], [address] [place], [place] [address]  [phone](06/06 0111) Antibody: NEG (06/06 0111) Rubella: Immune (11/08 0000) RPR: Nonreactive (11/08 0000)  HBsAg: Negative (11/08 0000)   HIV: Non-reactive (11/08 0000)  GBS: Negative (05/16 0000)   Assessment/Plan: Admit Pitocin  Epidural prn   Lindsey Walker 01/10/2018, 9:58 AM

## 2018-01-10 NOTE — Anesthesia Pain Management Evaluation Note (Signed)
  CRNA Pain Management Visit Note  Patient: Lindsey Walker, 32 y.o., female  "Hello I am a member of the anesthesia team at [facility]. We have an anesthesia team available at all times to provide care throughout the hospital, including epidural management and anesthesia for C-section. I don't know your plan for the delivery whether it a natural birth, water birth, IV sedation, nitrous supplementation, doula or epidural, but we want to meet your pain goals."   1.Was your pain managed to your expectations on prior hospitalizations?   Yes   2.What is your expectation for pain management during this hospitalization?     Epidural  3.How can we help you reach that goal? Epidural at pain goal. Last delivery was op with back pain. The patient is experiencing the same type of pain with this labor.  Record the patient's initial score and the patient's pain goal.   Pain: 5  Pain Goal: 8 [facility] wants you to be able to say your pain was always managed very well.  [name] 01/10/2018

## 2018-01-10 NOTE — Progress Notes (Signed)
Pt comfortable w/ epidural  FHT cat 1 Toco Q2 Cvx 5/80/-2 per RN exam  A/P:  Pitocin Exp mngt

## 2018-01-10 NOTE — Anesthesia Preprocedure Evaluation (Signed)
Anesthesia Evaluation  Patient identified by MRN, date of birth, ID band Patient awake    Reviewed: Allergy & Precautions, Patient's Chart, lab work & pertinent test results  Airway Mallampati: III       Dental   Pulmonary former smoker,    breath sounds clear to auscultation       Cardiovascular hypertension, Pt. on medications  Rhythm:Regular Rate:Normal     Neuro/Psych  Headaches (pseudotumor cerebri),    GI/Hepatic negative GI ROS, Neg liver ROS,   Endo/Other  Morbid obesity  Renal/GU negative Renal ROS     Musculoskeletal   Abdominal   Peds  Hematology  (+) anemia ,   Anesthesia Other Findings   Reproductive/Obstetrics (+) Pregnancy                             Lab Results  Component Value Date   WBC 11.9 (H) 01/10/2018   HGB 9.7 (L) 01/10/2018   HCT 31.0 (L) 01/10/2018   MCV 71.8 (L) 01/10/2018   PLT 285 01/10/2018   Lab Results  Component Value Date   CREATININE 0.58 01/03/2018   BUN 7 01/03/2018   NA 138 01/03/2018   K 3.8 01/03/2018   CL 105 01/03/2018   CO2 22 01/03/2018    Anesthesia Physical Anesthesia Plan  ASA: III  Anesthesia Plan: Epidural   Post-op Pain Management:    Induction:   PONV Risk Score and Plan: Treatment may vary due to age or medical condition  Airway Management Planned: Natural Airway  Additional Equipment:   Intra-op Plan:   Post-operative Plan:   Informed Consent: I have reviewed the patients History and Physical, chart, labs and discussed the procedure including the risks, benefits and alternatives for the proposed anesthesia with the patient or authorized representative who has indicated his/her understanding and acceptance.     Plan Discussed with:   Anesthesia Plan Comments:         Anesthesia Quick Evaluation

## 2018-01-10 NOTE — Anesthesia Procedure Notes (Signed)
Epidural Patient location during procedure: OB Start time: 01/10/2018 11:20 AM End time: 01/10/2018 11:29 AM  Staffing Anesthesiologist: Marcene DuosFitzgerald, Chandni Gagan, MD Performed: anesthesiologist   Preanesthetic Checklist Completed: patient identified, site marked, surgical consent, pre-op evaluation, timeout performed, IV checked, risks and benefits discussed and monitors and equipment checked  Epidural Patient position: sitting Prep: site prepped and draped and DuraPrep Patient monitoring: continuous pulse ox and blood pressure Approach: midline Location: L3-L4 Injection technique: LOR air  Needle:  Needle type: Tuohy  Needle gauge: 17 G Needle length: 9 cm and 9 Needle insertion depth: 11 cm Catheter type: closed end flexible Catheter size: 19 Gauge Catheter at skin depth: 18 cm Test dose: negative  Assessment Events: blood not aspirated, injection not painful, no injection resistance, negative IV test and no paresthesia

## 2018-01-10 NOTE — Progress Notes (Signed)
SVD of vigorous female infant w/ apgars of 9,9.  Placenta delivered spontaneous w/ 3VC.   1st degree & left labial lac repaired w/ 3-0 vicryl rapide.  Fundus firm.  EBL 350cc .

## 2018-01-11 ENCOUNTER — Ambulatory Visit: Payer: Self-pay

## 2018-01-11 LAB — CBC
HCT: 27.3 % — ABNORMAL LOW (ref 36.0–46.0)
HEMOGLOBIN: 8.7 g/dL — AB (ref 12.0–15.0)
MCH: 22.9 pg — AB (ref 26.0–34.0)
MCHC: 31.9 g/dL (ref 30.0–36.0)
MCV: 71.8 fL — ABNORMAL LOW (ref 78.0–100.0)
Platelets: 266 10*3/uL (ref 150–400)
RBC: 3.8 MIL/uL — ABNORMAL LOW (ref 3.87–5.11)
RDW: 16.5 % — AB (ref 11.5–15.5)
WBC: 15.6 10*3/uL — ABNORMAL HIGH (ref 4.0–10.5)

## 2018-01-11 MED ORDER — ACETAMINOPHEN 325 MG PO TABS: 650.0000 mg | ORAL_TABLET | Freq: Four times a day (QID) | ORAL | 1 refills | Status: AC | PRN

## 2018-01-11 MED ORDER — FERROUS SULFATE 325 (65 FE) MG PO TABS: 325.0000 mg | ORAL_TABLET | Freq: Every day | ORAL | 3 refills | Status: DC

## 2018-01-11 NOTE — Discharge Summary (Signed)
Obstetric Discharge Summary Reason for Admission: induction of labor Prenatal Procedures: none Intrapartum Procedures: spontaneous vaginal delivery Postpartum Procedures: none Complications-Operative and Postpartum: none Hemoglobin  Date Value Ref Range Status  01/11/2018 8.7 (L) 12.0 - 15.0 g/dL Final   HCT  Date Value Ref Range Status  01/11/2018 27.3 (L) 36.0 - 46.0 % Final    Physical Exam:  General: alert, cooperative and no distress Lochia: appropriate Uterine Fundus: firm Incision: healing well DVT Evaluation: No evidence of DVT seen on physical exam.  Discharge Diagnoses: Term Pregnancy-delivered  Discharge Information: Date: 01/11/2018 Activity: pelvic rest Diet: routine Medications: PNV, Ibuprofen and Iron Condition: stable Instructions: refer to practice specific booklet Discharge to: home   Newborn Data: Live born female  Birth Weight: 8 lb 15.6 oz (4071 g) APGAR: 9, 9  Newborn Delivery   Birth date/time:  01/10/2018 17:31:00 Delivery type:  Vaginal, Spontaneous     Home with mother.  Roselle LocusJames E Toshiko Kemler II 01/11/2018, 9:38 AM

## 2018-01-11 NOTE — Lactation Note (Signed)
This note was copied from a baby's chart. Lactation Consultation Note  Patient Name: Girl Arther DamesJessica Crawford RUEAV'WToday's Date: 01/11/2018 Reason for consult: Initial assessment;Term;Infant weight loss(per mom difficult LATCH )  Baby is 22 hours old.  Mom and dad explained placed the breast milk in the artifical nipple and let the baby suck the  Milk out of the nipple. Last feeding was 2 ml.  LC discussed other tools that could be helpful/ spoon or curved tip , possible a need for Nipple Shield.  Mom has been pumping several times with her DEBP Lansinoh with EBM yield 2-10 ml.  Per mom baby recently fed 2 ml of EBM and asleep/ and mom pumped.  LC offered since the baby took only 2 ml , offered to place abby STS and wok on latching.  And mom declined for now and requested a LC consult this evening.  LC  Mentioned she would leave a note for the 7p LC to see mom.   Mother informed of post-discharge support and given phone number to the lactation department, including services for phone call assistance; out-patient appointments; and breastfeeding support group. List of other breastfeeding resources in the community given in the handout. Encouraged mother to call for problems or concerns related to breastfeeding.    Maternal Data Does the patient have breastfeeding experience prior to this delivery?: Yes  Feeding Feeding Type: (pe rmom baby last fed at 3 :30 p 2 ml of EBM ) Nipple Type: Slow - flow  LATCH Score                   Interventions Interventions: Breast feeding basics reviewed  Lactation Tools Discussed/Used WIC Program: No Pump Review: (mom independent with her set up of her DEBP )   Consult Status Consult Status: Follow-up Date: 01/11/18 Follow-up type: In-patient    Matilde SprangMargaret Ann Miosotis Wetsel 01/11/2018, 4:27 PM

## 2018-01-11 NOTE — Anesthesia Postprocedure Evaluation (Signed)
Anesthesia Post Note  Patient: Lindsey Walker  Procedure(s) Performed: AN AD HOC LABOR EPIDURAL     Patient location during evaluation: Mother Baby Anesthesia Type: Epidural Level of consciousness: awake and alert and oriented Pain management: satisfactory to patient Vital Signs Assessment: post-procedure vital signs reviewed and stable Respiratory status: spontaneous breathing and nonlabored ventilation Cardiovascular status: stable Postop Assessment: no headache, no backache, no signs of nausea or vomiting, adequate PO intake, patient able to bend at knees and able to ambulate (patient up walking) Anesthetic complications: no    Last Vitals:  Vitals:   01/11/18 0040 01/11/18 0500  BP: 125/63 125/85  Pulse: 84 73  Resp: 20 17  Temp: 36.7 C 36.5 C  SpO2:      Last Pain:  Vitals:   01/11/18 0715  TempSrc:   PainSc: 0-No pain   Pain Goal:                 Madison HickmanGREGORY,Anacarolina Evelyn

## 2018-01-18 NOTE — Telephone Encounter (Signed)
Preadmission screen  

## 2018-10-11 LAB — OB RESULTS CONSOLE ABO/RH: RH Type: POSITIVE

## 2018-10-11 LAB — OB RESULTS CONSOLE RPR: RPR: NONREACTIVE

## 2018-10-11 LAB — OB RESULTS CONSOLE HEPATITIS B SURFACE ANTIGEN: Hepatitis B Surface Ag: NEGATIVE

## 2018-10-11 LAB — OB RESULTS CONSOLE RUBELLA ANTIBODY, IGM: Rubella: IMMUNE

## 2018-10-11 LAB — OB RESULTS CONSOLE ANTIBODY SCREEN: Antibody Screen: NEGATIVE

## 2018-10-11 LAB — OB RESULTS CONSOLE HIV ANTIBODY (ROUTINE TESTING): HIV: NONREACTIVE

## 2018-10-11 LAB — OB RESULTS CONSOLE GC/CHLAMYDIA
Chlamydia: NEGATIVE
Gonorrhea: NEGATIVE

## 2019-01-31 ENCOUNTER — Other Ambulatory Visit (HOSPITAL_COMMUNITY): Payer: Self-pay | Admitting: *Deleted

## 2019-02-03 ENCOUNTER — Other Ambulatory Visit: Payer: Self-pay

## 2019-02-03 ENCOUNTER — Ambulatory Visit (HOSPITAL_COMMUNITY)
Admission: RE | Admit: 2019-02-03 | Discharge: 2019-02-03 | Disposition: A | Discharge status: Home / Self Care | Payer: 59 | Source: Ambulatory Visit | Attending: Obstetrics and Gynecology | Admitting: Obstetrics and Gynecology

## 2019-02-03 DIAGNOSIS — D509 Iron deficiency anemia, unspecified: Secondary | ICD-10-CM | POA: Diagnosis present

## 2019-02-03 MED ORDER — SODIUM CHLORIDE 0.9 % IV SOLN
510.0000 mg | INTRAVENOUS | Status: DC
  Administered 2019-02-03: 510 mg via INTRAVENOUS
  Filled 2019-02-03: qty 17

## 2019-02-03 NOTE — Discharge Instructions (Signed)

## 2019-02-06 ENCOUNTER — Encounter (HOSPITAL_COMMUNITY): Payer: BLUE CROSS/BLUE SHIELD

## 2019-02-10 ENCOUNTER — Encounter (HOSPITAL_COMMUNITY)
Admission: RE | Admit: 2019-02-10 | Discharge: 2019-02-10 | Disposition: A | Discharge status: Home / Self Care | Payer: 59 | Source: Ambulatory Visit | Attending: Obstetrics and Gynecology | Admitting: Obstetrics and Gynecology

## 2019-02-10 ENCOUNTER — Other Ambulatory Visit: Payer: Self-pay

## 2019-02-10 DIAGNOSIS — D509 Iron deficiency anemia, unspecified: Secondary | ICD-10-CM | POA: Insufficient documentation

## 2019-02-10 MED ORDER — SODIUM CHLORIDE 0.9 % IV SOLN
510.0000 mg | INTRAVENOUS | Status: DC
  Administered 2019-02-10: 10:00:00 510 mg via INTRAVENOUS
  Filled 2019-02-10: qty 17

## 2019-02-18 ENCOUNTER — Encounter (HOSPITAL_COMMUNITY): Payer: Self-pay

## 2019-04-02 LAB — OB RESULTS CONSOLE GBS: GBS: NEGATIVE

## 2019-04-07 ENCOUNTER — Encounter (HOSPITAL_COMMUNITY): Payer: Self-pay | Admitting: *Deleted

## 2019-04-07 NOTE — Patient Instructions (Signed)
Lindsey Walker  04/07/2019   Your procedure is scheduled on:  04/23/2019  Arrive at 59 at Entrance C on [address] at [facility]  and [employer]. You are invited to use the FREE valet parking or use the Visitor's parking deck.  Pick up the phone at the desk and dial [phone].  Call this number if you have problems the morning of surgery: [phone]  Remember:   Do not eat food:(After Midnight) Desps de medianoche.  Do not drink clear liquids: (After Midnight) Desps de medianoche.  Take these medicines the morning of surgery with A SIP OF WATER:  none   Do not wear jewelry, make-up or nail polish.  Do not wear lotions, powders, or perfumes. Do not wear deodorant.  Do not shave 48 hours prior to surgery.  Do not bring valuables to the hospital.  [facility] is not   responsible for any belongings or valuables brought to the hospital.  Contacts, dentures or bridgework may not be worn into surgery.  Leave suitcase in the car. After surgery it may be brought to your room.  For patients admitted to the hospital, checkout time is 11:00 AM the day of              discharge.      Please read over the following fact sheets that you were given:     Preparing for Surgery

## 2019-04-08 ENCOUNTER — Telehealth (HOSPITAL_COMMUNITY): Payer: Self-pay | Admitting: *Deleted

## 2019-04-08 NOTE — Telephone Encounter (Signed)
Preadmission screen  

## 2019-04-09 ENCOUNTER — Telehealth (HOSPITAL_COMMUNITY): Payer: Self-pay | Admitting: *Deleted

## 2019-04-09 ENCOUNTER — Encounter (HOSPITAL_COMMUNITY): Payer: Self-pay

## 2019-04-09 NOTE — Telephone Encounter (Signed)
Preadmission screen  

## 2019-04-16 NOTE — H&P (Addendum)
Lindsey Walker is a 33 y.o. female [phone] at 72 weeks presenting for primary C/S and BTL for suspected fetal macrosomia.  Last u/s at 36 weeks showed EFW >99% at 9#1 (4121g). Antepartum course complicated by anemia for which she is taking ferrous sulfate; last Hgb 10.7.  She has hx of pseudotumor cerebri which has been asymptomatic this pregnancy.  Both prior pregnancies were complicated by GHTN; normal BP this pregnancy and taking low dose ASA.  GBS negative.  OB History    Gravida  3   Para  2   Term  2   Preterm      AB      Living  2     SAB      TAB      Ectopic      Multiple  0   Live Births  2          Past Medical History:  Diagnosis Date  . Anemia   . Endometriosis   . Headache   . History of gestational hypertension   . Hypertension    PIH  . Pseudotumor cerebri 2016  . Vaginal Pap smear, abnormal    Past Surgical History:  Procedure Laterality Date  . ABLATION ON ENDOMETRIOSIS  2003  . COLPOSCOPY     Family History: family history includes Atrial fibrillation in her maternal grandmother; Cancer in her maternal grandmother; Hypertension in her maternal grandmother; Parkinson's disease in her maternal grandfather; Thyroid disease in her maternal grandmother and mother. She was adopted. Social History:  reports that she has quit smoking. She has never used smokeless tobacco. She reports current alcohol use. She reports that she does not use drugs.     Maternal Diabetes: No Genetic Screening: Normal Maternal Ultrasounds/Referrals: Normal Fetal Ultrasounds or other Referrals:  None Maternal Substance Abuse:  No Significant Maternal Medications:  None Significant Maternal Lab Results:  Group B Strep negative Other Comments:  None  ROS Maternal Medical History:  Prenatal complications: no prenatal complications Prenatal Complications - Diabetes: none.      unknown if currently breastfeeding. Maternal Exam:  Abdomen: Patient reports no  abdominal tenderness. Fundal height is S>D.   Estimated fetal weight is 10#.       Physical Exam  Constitutional: She is oriented to person, place, and time. She appears well-developed and well-nourished.  GI: Soft. There is no rebound and no guarding.  Neurological: She is alert and oriented to person, place, and time.  Skin: Skin is warm and dry.  Psychiatric: She has a normal mood and affect. Her behavior is normal.    Prenatal labs: ABO, Rh: B/Positive/-- (03/06 0000) Antibody: Negative (03/06 0000) Rubella: Immune (03/06 0000) RPR: Nonreactive (03/06 0000)  HBsAg: Negative (03/06 0000)  HIV: Non-reactive (03/06 0000)  GBS: Negative (08/26 0000)   Assessment/Plan: 33yo [id number] at 39 weeks for primary C/S and BTL Patient has been counseled re: risk of bleeding, infection, scarring, and damage to surrounding structures.  Regarding BTL, patient is informed of risk of permanence, failure and regret.  All questions have been answered and the patient wishes to proceed.   Lindsey Walker 04/16/2019, 8:55 AM

## 2019-04-21 ENCOUNTER — Ambulatory Visit (HOSPITAL_COMMUNITY)
Admission: RE | Admit: 2019-04-21 | Discharge: 2019-04-21 | Disposition: A | Discharge status: Home / Self Care | Payer: 59 | Source: Ambulatory Visit | Attending: Obstetrics and Gynecology | Admitting: Obstetrics and Gynecology

## 2019-04-21 ENCOUNTER — Other Ambulatory Visit: Payer: Self-pay

## 2019-04-21 DIAGNOSIS — Z01812 Encounter for preprocedural laboratory examination: Secondary | ICD-10-CM | POA: Insufficient documentation

## 2019-04-21 DIAGNOSIS — Z20828 Contact with and (suspected) exposure to other viral communicable diseases: Secondary | ICD-10-CM | POA: Insufficient documentation

## 2019-04-21 HISTORY — DX: Anemia, unspecified: D64.9

## 2019-04-21 HISTORY — DX: Personal history of other complications of pregnancy, childbirth and the puerperium: Z87.59

## 2019-04-21 LAB — CBC
HCT: 34.3 % — ABNORMAL LOW (ref 36.0–46.0)
Hemoglobin: 10.6 g/dL — ABNORMAL LOW (ref 12.0–15.0)
MCH: 22.9 pg — ABNORMAL LOW (ref 26.0–34.0)
MCHC: 30.9 g/dL (ref 30.0–36.0)
MCV: 74.1 fL — ABNORMAL LOW (ref 80.0–100.0)
Platelets: 290 10*3/uL (ref 150–400)
RBC: 4.63 MIL/uL (ref 3.87–5.11)
RDW: 19.6 % — ABNORMAL HIGH (ref 11.5–15.5)
WBC: 12.1 10*3/uL — ABNORMAL HIGH (ref 4.0–10.5)
nRBC: 0 % (ref 0.0–0.2)

## 2019-04-21 LAB — TYPE AND SCREEN
ABO/RH(D): B POS
Antibody Screen: NEGATIVE

## 2019-04-21 LAB — RPR: RPR Ser Ql: NONREACTIVE

## 2019-04-21 LAB — SARS CORONAVIRUS 2 (TAT 6-24 HRS): SARS Coronavirus 2: NEGATIVE

## 2019-04-21 LAB — ABO/RH: ABO/RH(D): B POS

## 2019-04-21 NOTE — MAU Note (Signed)
Asymptomatic, swab collected. Lab called 

## 2019-04-22 MED ORDER — DEXTROSE 5 % IV SOLN
3.0000 g | INTRAVENOUS | Status: AC
  Administered 2019-04-23: 3 g via INTRAVENOUS
  Filled 2019-04-22: qty 3000

## 2019-04-23 ENCOUNTER — Other Ambulatory Visit: Payer: Self-pay

## 2019-04-23 ENCOUNTER — Inpatient Hospital Stay (HOSPITAL_COMMUNITY): Payer: 59 | Admitting: Anesthesiology

## 2019-04-23 ENCOUNTER — Inpatient Hospital Stay (HOSPITAL_COMMUNITY)
Admission: RE | Admit: 2019-04-23 | Discharge: 2019-04-27 | DRG: 785 | Disposition: A | Discharge status: Home / Self Care | Payer: 59 | Attending: Neonatology | Admitting: Neonatology

## 2019-04-23 ENCOUNTER — Encounter (HOSPITAL_COMMUNITY): Payer: Self-pay

## 2019-04-23 ENCOUNTER — Encounter (HOSPITAL_COMMUNITY)
Admission: RE | Disposition: A | Discharge status: Home / Self Care | Payer: Self-pay | Source: Home / Self Care | Attending: Obstetrics & Gynecology

## 2019-04-23 DIAGNOSIS — Z302 Encounter for sterilization: Secondary | ICD-10-CM

## 2019-04-23 DIAGNOSIS — O321XX0 Maternal care for breech presentation, not applicable or unspecified: Secondary | ICD-10-CM | POA: Diagnosis present

## 2019-04-23 DIAGNOSIS — Z87891 Personal history of nicotine dependence: Secondary | ICD-10-CM

## 2019-04-23 DIAGNOSIS — Z98891 History of uterine scar from previous surgery: Secondary | ICD-10-CM

## 2019-04-23 DIAGNOSIS — Z20828 Contact with and (suspected) exposure to other viral communicable diseases: Secondary | ICD-10-CM | POA: Diagnosis present

## 2019-04-23 DIAGNOSIS — Z3A39 39 weeks gestation of pregnancy: Secondary | ICD-10-CM

## 2019-04-23 DIAGNOSIS — O9902 Anemia complicating childbirth: Secondary | ICD-10-CM | POA: Diagnosis present

## 2019-04-23 DIAGNOSIS — O99214 Obesity complicating childbirth: Secondary | ICD-10-CM | POA: Diagnosis present

## 2019-04-23 DIAGNOSIS — O3663X0 Maternal care for excessive fetal growth, third trimester, not applicable or unspecified: Secondary | ICD-10-CM | POA: Diagnosis present

## 2019-04-23 DIAGNOSIS — Z23 Encounter for immunization: Secondary | ICD-10-CM

## 2019-04-23 DIAGNOSIS — O26893 Other specified pregnancy related conditions, third trimester: Secondary | ICD-10-CM | POA: Diagnosis present

## 2019-04-23 DIAGNOSIS — D649 Anemia, unspecified: Secondary | ICD-10-CM | POA: Diagnosis present

## 2019-04-23 SURGERY — Surgical Case
Anesthesia: Spinal | Laterality: Bilateral | Wound class: Clean Contaminated

## 2019-04-23 MED ORDER — DEXTROSE 5 % IV SOLN
INTRAVENOUS | Status: AC
  Filled 2019-04-23: qty 3000

## 2019-04-23 MED ORDER — SIMETHICONE 80 MG PO CHEW
80.0000 mg | CHEWABLE_TABLET | ORAL | Status: DC | PRN
  Administered 2019-04-23: 80 mg via ORAL

## 2019-04-23 MED ORDER — SENNOSIDES-DOCUSATE SODIUM 8.6-50 MG PO TABS
2.0000 | ORAL_TABLET | ORAL | Status: DC
  Administered 2019-04-23 – 2019-04-26 (×4): 2 via ORAL
  Filled 2019-04-23 (×4): qty 2

## 2019-04-23 MED ORDER — KETOROLAC TROMETHAMINE 30 MG/ML IJ SOLN
30.0000 mg | Freq: Four times a day (QID) | INTRAMUSCULAR | Status: AC | PRN
  Administered 2019-04-23: 30 mg via INTRAMUSCULAR

## 2019-04-23 MED ORDER — DIPHENHYDRAMINE HCL 25 MG PO CAPS: 25.0000 mg | ORAL_CAPSULE | Freq: Four times a day (QID) | ORAL | Status: DC | PRN

## 2019-04-23 MED ORDER — KETOROLAC TROMETHAMINE 30 MG/ML IJ SOLN
INTRAMUSCULAR | Status: AC
  Filled 2019-04-23: qty 1

## 2019-04-23 MED ORDER — NALBUPHINE HCL 10 MG/ML IJ SOLN
5.0000 mg | INTRAMUSCULAR | Status: DC | PRN
  Filled 2019-04-23: qty 0.5

## 2019-04-23 MED ORDER — LIDOCAINE-EPINEPHRINE 2 %-1:100000 IJ SOLN
INTRAMUSCULAR | Status: DC | PRN
  Administered 2019-04-23: 3 mL
  Administered 2019-04-23: 5 mL
  Administered 2019-04-23 (×2): 3 mL
  Administered 2019-04-23: 5 mL via PERINEURAL

## 2019-04-23 MED ORDER — STERILE WATER FOR IRRIGATION IR SOLN
Status: DC | PRN
  Administered 2019-04-23: 1000 mL

## 2019-04-23 MED ORDER — INFLUENZA VAC A&B SA ADJ QUAD 0.5 ML IM PRSY
0.5000 mL | PREFILLED_SYRINGE | INTRAMUSCULAR | Status: DC
  Filled 2019-04-23: qty 0.5

## 2019-04-23 MED ORDER — OXYCODONE-ACETAMINOPHEN 5-325 MG PO TABS
1.0000 | ORAL_TABLET | ORAL | Status: DC | PRN
  Administered 2019-04-24 (×3): 1 via ORAL
  Administered 2019-04-24: 2 via ORAL
  Administered 2019-04-25 – 2019-04-27 (×8): 1 via ORAL
  Filled 2019-04-23 (×7): qty 1
  Filled 2019-04-23: qty 2
  Filled 2019-04-23 (×4): qty 1

## 2019-04-23 MED ORDER — SIMETHICONE 80 MG PO CHEW
80.0000 mg | CHEWABLE_TABLET | ORAL | Status: DC
  Administered 2019-04-23 – 2019-04-26 (×4): 80 mg via ORAL
  Filled 2019-04-23 (×4): qty 1

## 2019-04-23 MED ORDER — SODIUM CHLORIDE 0.9 % IV SOLN
INTRAVENOUS | Status: DC | PRN
  Administered 2019-04-23: 14:00:00 via INTRAVENOUS

## 2019-04-23 MED ORDER — ONDANSETRON HCL 4 MG/2ML IJ SOLN: 4.0000 mg | Freq: Three times a day (TID) | INTRAMUSCULAR | Status: DC | PRN

## 2019-04-23 MED ORDER — LIDOCAINE-EPINEPHRINE (PF) 2 %-1:200000 IJ SOLN
INTRAMUSCULAR | Status: AC
  Filled 2019-04-23: qty 20

## 2019-04-23 MED ORDER — KETOROLAC TROMETHAMINE 30 MG/ML IJ SOLN: 30.0000 mg | Freq: Once | INTRAMUSCULAR | Status: DC | PRN

## 2019-04-23 MED ORDER — LACTATED RINGERS IV SOLN
INTRAVENOUS | Status: DC
  Administered 2019-04-23 (×3): via INTRAVENOUS

## 2019-04-23 MED ORDER — ZOLPIDEM TARTRATE 5 MG PO TABS: 5.0000 mg | ORAL_TABLET | Freq: Every evening | ORAL | Status: DC | PRN

## 2019-04-23 MED ORDER — MORPHINE SULFATE (PF) 0.5 MG/ML IJ SOLN
INTRAMUSCULAR | Status: DC | PRN
  Administered 2019-04-23: 3 mg via EPIDURAL

## 2019-04-23 MED ORDER — FERROUS SULFATE 325 (65 FE) MG PO TABS
325.0000 mg | ORAL_TABLET | Freq: Every day | ORAL | Status: DC
  Administered 2019-04-24 – 2019-04-27 (×4): 325 mg via ORAL
  Filled 2019-04-23 (×4): qty 1

## 2019-04-23 MED ORDER — SODIUM CHLORIDE 0.9% FLUSH: 3.0000 mL | INTRAVENOUS | Status: DC | PRN

## 2019-04-23 MED ORDER — LIDOCAINE-EPINEPHRINE (PF) 2 %-1:200000 IJ SOLN: INTRAMUSCULAR | Status: DC | PRN

## 2019-04-23 MED ORDER — NALOXONE HCL 4 MG/10ML IJ SOLN
1.0000 ug/kg/h | INTRAVENOUS | Status: DC | PRN
  Filled 2019-04-23: qty 5

## 2019-04-23 MED ORDER — ENOXAPARIN SODIUM 80 MG/0.8ML ~~LOC~~ SOLN
80.0000 mg | SUBCUTANEOUS | Status: DC
  Administered 2019-04-24: 80 mg via SUBCUTANEOUS
  Filled 2019-04-23 (×4): qty 0.8

## 2019-04-23 MED ORDER — LORATADINE 10 MG PO TABS
10.0000 mg | ORAL_TABLET | Freq: Every day | ORAL | Status: DC
  Administered 2019-04-23 – 2019-04-27 (×5): 10 mg via ORAL
  Filled 2019-04-23 (×5): qty 1

## 2019-04-23 MED ORDER — FLEET ENEMA 7-19 GM/118ML RE ENEM: 1.0000 | ENEMA | Freq: Every day | RECTAL | Status: DC | PRN

## 2019-04-23 MED ORDER — MENTHOL 3 MG MT LOZG
1.0000 | LOZENGE | OROMUCOSAL | Status: DC | PRN
  Administered 2019-04-23 – 2019-04-26 (×2): 3 mg via ORAL
  Filled 2019-04-23 (×6): qty 9

## 2019-04-23 MED ORDER — FENTANYL CITRATE (PF) 100 MCG/2ML IJ SOLN: 25.0000 ug | INTRAMUSCULAR | Status: DC | PRN

## 2019-04-23 MED ORDER — PHENYLEPHRINE HCL-NACL 20-0.9 MG/250ML-% IV SOLN
INTRAVENOUS | Status: AC
  Filled 2019-04-23: qty 250

## 2019-04-23 MED ORDER — ACETAMINOPHEN 500 MG PO TABS
1000.0000 mg | ORAL_TABLET | Freq: Four times a day (QID) | ORAL | Status: AC
  Administered 2019-04-23 – 2019-04-24 (×3): 1000 mg via ORAL
  Filled 2019-04-23 (×3): qty 2

## 2019-04-23 MED ORDER — SCOPOLAMINE 1 MG/3DAYS TD PT72
1.0000 | MEDICATED_PATCH | Freq: Once | TRANSDERMAL | Status: AC
  Administered 2019-04-23: 1.5 mg via TRANSDERMAL

## 2019-04-23 MED ORDER — PRENATAL MULTIVITAMIN CH
1.0000 | ORAL_TABLET | Freq: Every day | ORAL | Status: DC
  Administered 2019-04-24 – 2019-04-26 (×3): 1 via ORAL
  Filled 2019-04-23 (×3): qty 1

## 2019-04-23 MED ORDER — FENTANYL CITRATE (PF) 100 MCG/2ML IJ SOLN
INTRAMUSCULAR | Status: DC | PRN
  Administered 2019-04-23: 85 ug via EPIDURAL

## 2019-04-23 MED ORDER — MORPHINE SULFATE (PF) 0.5 MG/ML IJ SOLN
INTRAMUSCULAR | Status: AC
  Filled 2019-04-23: qty 10

## 2019-04-23 MED ORDER — OXYTOCIN 40 UNITS IN NORMAL SALINE INFUSION - SIMPLE MED: 2.5000 [IU]/h | INTRAVENOUS | Status: AC

## 2019-04-23 MED ORDER — IBUPROFEN 800 MG PO TABS
800.0000 mg | ORAL_TABLET | Freq: Four times a day (QID) | ORAL | Status: DC
  Administered 2019-04-23 – 2019-04-27 (×14): 800 mg via ORAL
  Filled 2019-04-23 (×14): qty 1

## 2019-04-23 MED ORDER — SCOPOLAMINE 1 MG/3DAYS TD PT72
MEDICATED_PATCH | TRANSDERMAL | Status: AC
  Filled 2019-04-23: qty 1

## 2019-04-23 MED ORDER — NALOXONE HCL 0.4 MG/ML IJ SOLN: 0.4000 mg | INTRAMUSCULAR | Status: DC | PRN

## 2019-04-23 MED ORDER — NALBUPHINE HCL 10 MG/ML IJ SOLN
5.0000 mg | Freq: Once | INTRAMUSCULAR | Status: DC | PRN
  Filled 2019-04-23: qty 0.5

## 2019-04-23 MED ORDER — PHENYLEPHRINE HCL-NACL 20-0.9 MG/250ML-% IV SOLN
INTRAVENOUS | Status: DC | PRN
  Administered 2019-04-23: 60 ug/min via INTRAVENOUS

## 2019-04-23 MED ORDER — DIPHENHYDRAMINE HCL 25 MG PO CAPS: 25.0000 mg | ORAL_CAPSULE | ORAL | Status: DC | PRN

## 2019-04-23 MED ORDER — ACETAMINOPHEN 325 MG PO TABS
650.0000 mg | ORAL_TABLET | ORAL | Status: DC | PRN
  Administered 2019-04-25 – 2019-04-26 (×2): 650 mg via ORAL
  Filled 2019-04-23 (×2): qty 2

## 2019-04-23 MED ORDER — LIDOCAINE HCL (PF) 1 % IJ SOLN
INTRAMUSCULAR | Status: AC
  Filled 2019-04-23: qty 5

## 2019-04-23 MED ORDER — BISACODYL 10 MG RE SUPP: 10.0000 mg | Freq: Every day | RECTAL | Status: DC | PRN

## 2019-04-23 MED ORDER — DIBUCAINE (PERIANAL) 1 % EX OINT: 1.0000 "application " | TOPICAL_OINTMENT | CUTANEOUS | Status: DC | PRN

## 2019-04-23 MED ORDER — LACTATED RINGERS IV SOLN
INTRAVENOUS | Status: DC
  Administered 2019-04-23 – 2019-04-24 (×2): via INTRAVENOUS

## 2019-04-23 MED ORDER — ACETAMINOPHEN 10 MG/ML IV SOLN: 1000.0000 mg | Freq: Once | INTRAVENOUS | Status: DC | PRN

## 2019-04-23 MED ORDER — WITCH HAZEL-GLYCERIN EX PADS: 1.0000 "application " | MEDICATED_PAD | CUTANEOUS | Status: DC | PRN

## 2019-04-23 MED ORDER — COCONUT OIL OIL
1.0000 "application " | TOPICAL_OIL | Status: DC | PRN
  Administered 2019-04-26: 1 via TOPICAL

## 2019-04-23 MED ORDER — DEXTROSE 5 % IV SOLN
3.0000 g | Freq: Three times a day (TID) | INTRAVENOUS | Status: AC
  Administered 2019-04-23: 3 g via INTRAVENOUS
  Filled 2019-04-23 (×3): qty 3000

## 2019-04-23 MED ORDER — DEXAMETHASONE SODIUM PHOSPHATE 10 MG/ML IJ SOLN
INTRAMUSCULAR | Status: DC | PRN
  Administered 2019-04-23: 10 mg via INTRAVENOUS

## 2019-04-23 MED ORDER — KETOROLAC TROMETHAMINE 30 MG/ML IJ SOLN
30.0000 mg | Freq: Four times a day (QID) | INTRAMUSCULAR | Status: AC | PRN
  Administered 2019-04-23: 30 mg via INTRAVENOUS
  Filled 2019-04-23: qty 1

## 2019-04-23 MED ORDER — TETANUS-DIPHTH-ACELL PERTUSSIS 5-2.5-18.5 LF-MCG/0.5 IM SUSP: 0.5000 mL | Freq: Once | INTRAMUSCULAR | Status: DC

## 2019-04-23 MED ORDER — DIPHENHYDRAMINE HCL 50 MG/ML IJ SOLN: 12.5000 mg | INTRAMUSCULAR | Status: DC | PRN

## 2019-04-23 MED ORDER — SODIUM CHLORIDE 0.9 % IR SOLN
Status: DC | PRN
  Administered 2019-04-23: 1

## 2019-04-23 MED ORDER — SODIUM CHLORIDE 0.9 % IV SOLN
INTRAVENOUS | Status: DC | PRN
  Administered 2019-04-23: 14:00:00 40 [IU] via INTRAVENOUS

## 2019-04-23 MED ORDER — ONDANSETRON HCL 4 MG/2ML IJ SOLN
INTRAMUSCULAR | Status: AC
  Filled 2019-04-23: qty 2

## 2019-04-23 MED ORDER — FENTANYL CITRATE (PF) 100 MCG/2ML IJ SOLN
INTRAMUSCULAR | Status: AC
  Filled 2019-04-23: qty 2

## 2019-04-23 MED ORDER — ONDANSETRON HCL 4 MG/2ML IJ SOLN
INTRAMUSCULAR | Status: DC | PRN
  Administered 2019-04-23: 4 mg via INTRAVENOUS

## 2019-04-23 MED ORDER — SIMETHICONE 80 MG PO CHEW
80.0000 mg | CHEWABLE_TABLET | Freq: Three times a day (TID) | ORAL | Status: DC
  Administered 2019-04-24 – 2019-04-27 (×9): 80 mg via ORAL
  Filled 2019-04-23 (×10): qty 1

## 2019-04-23 MED ORDER — DEXAMETHASONE SODIUM PHOSPHATE 10 MG/ML IJ SOLN
INTRAMUSCULAR | Status: AC
  Filled 2019-04-23: qty 1

## 2019-04-23 SURGICAL SUPPLY — 40 items
ADH SKN CLS APL DERMABOND .7 (GAUZE/BANDAGES/DRESSINGS)
APL SKNCLS STERI-STRIP NONHPOA (GAUZE/BANDAGES/DRESSINGS) ×1
BENZOIN TINCTURE PRP APPL 2/3 (GAUZE/BANDAGES/DRESSINGS) ×3 IMPLANT
CLAMP CORD UMBIL (MISCELLANEOUS) IMPLANT
CLOSURE STERI STRIP 1/2 X4 (GAUZE/BANDAGES/DRESSINGS) ×2 IMPLANT
CLOSURE WOUND 1/2 X4 (GAUZE/BANDAGES/DRESSINGS)
CLOTH BEACON ORANGE TIMEOUT ST (SAFETY) ×3 IMPLANT
DERMABOND ADVANCED (GAUZE/BANDAGES/DRESSINGS)
DERMABOND ADVANCED .7 DNX12 (GAUZE/BANDAGES/DRESSINGS) IMPLANT
DRSG OPSITE POSTOP 4X10 (GAUZE/BANDAGES/DRESSINGS) ×3 IMPLANT
ELECT REM PT RETURN 9FT ADLT (ELECTROSURGICAL) ×3
ELECTRODE REM PT RTRN 9FT ADLT (ELECTROSURGICAL) ×1 IMPLANT
EXTRACTOR VACUUM KIWI (MISCELLANEOUS) IMPLANT
GLOVE BIO SURGEON STRL SZ 6 (GLOVE) ×3 IMPLANT
GLOVE BIOGEL PI IND STRL 6 (GLOVE) ×2 IMPLANT
GLOVE BIOGEL PI IND STRL 7.0 (GLOVE) ×1 IMPLANT
GLOVE BIOGEL PI INDICATOR 6 (GLOVE) ×4
GLOVE BIOGEL PI INDICATOR 7.0 (GLOVE) ×2
GOWN STRL REUS W/TWL LRG LVL3 (GOWN DISPOSABLE) ×6 IMPLANT
HOVERMATT SINGLE USE (MISCELLANEOUS) ×2 IMPLANT
KIT ABG SYR 3ML LUER SLIP (SYRINGE) ×3 IMPLANT
NDL HYPO 25X5/8 SAFETYGLIDE (NEEDLE) ×1 IMPLANT
NEEDLE HYPO 25X5/8 SAFETYGLIDE (NEEDLE) ×3 IMPLANT
NS IRRIG 1000ML POUR BTL (IV SOLUTION) ×3 IMPLANT
PACK C SECTION WH (CUSTOM PROCEDURE TRAY) ×3 IMPLANT
PAD OB MATERNITY 4.3X12.25 (PERSONAL CARE ITEMS) ×3 IMPLANT
PENCIL SMOKE EVAC W/HOLSTER (ELECTROSURGICAL) ×3 IMPLANT
RETRACTOR TRAXI PANNICULUS (MISCELLANEOUS) IMPLANT
STRIP CLOSURE SKIN 1/2X4 (GAUZE/BANDAGES/DRESSINGS) IMPLANT
SUT CHROMIC 0 CTX 36 (SUTURE) ×9 IMPLANT
SUT MON AB 2-0 CT1 27 (SUTURE) ×3 IMPLANT
SUT PDS AB 0 CT1 27 (SUTURE) IMPLANT
SUT PLAIN 0 NONE (SUTURE) IMPLANT
SUT PLAIN 2 0 XLH (SUTURE) ×2 IMPLANT
SUT VIC AB 0 CT1 36 (SUTURE) IMPLANT
SUT VIC AB 4-0 KS 27 (SUTURE) IMPLANT
TOWEL OR 17X24 6PK STRL BLUE (TOWEL DISPOSABLE) ×3 IMPLANT
TRAXI PANNICULUS RETRACTOR (MISCELLANEOUS) ×2
TRAY FOLEY W/BAG SLVR 14FR LF (SET/KITS/TRAYS/PACK) IMPLANT
WATER STERILE IRR 1000ML POUR (IV SOLUTION) ×3 IMPLANT

## 2019-04-23 NOTE — Anesthesia Preprocedure Evaluation (Addendum)
Anesthesia Evaluation  Patient identified by MRN, date of birth, ID band Patient awake    Reviewed: Allergy & Precautions, NPO status , Patient's Chart, lab work & pertinent test results  Airway Mallampati: III  TM Distance: >3 FB Neck ROM: Full    Dental no notable dental hx. (+) Teeth Intact, Dental Advisory Given   Pulmonary neg pulmonary ROS, former smoker,    Pulmonary exam normal breath sounds clear to auscultation       Cardiovascular hypertension, Pt. on medications negative cardio ROS Normal cardiovascular exam Rhythm:Regular Rate:Normal     Neuro/Psych  Headaches, Pseudotumor cerebri, asymptomatic this pregnancy negative psych ROS   GI/Hepatic negative GI ROS, Neg liver ROS,   Endo/Other  Morbid obesity  Renal/GU negative Renal ROS  negative genitourinary   Musculoskeletal negative musculoskeletal ROS (+)   Abdominal   Peds  Hematology  (+) Blood dyscrasia (Hgb 10.6), anemia ,   Anesthesia Other Findings   Reproductive/Obstetrics (+) Pregnancy                             Anesthesia Physical Anesthesia Plan  ASA: III  Anesthesia Plan: Spinal   Post-op Pain Management:    Induction:   PONV Risk Score and Plan: Treatment may vary due to age or medical condition  Airway Management Planned: Natural Airway  Additional Equipment:   Intra-op Plan:   Post-operative Plan:   Informed Consent: I have reviewed the patients History and Physical, chart, labs and discussed the procedure including the risks, benefits and alternatives for the proposed anesthesia with the patient or authorized representative who has indicated his/her understanding and acceptance.     Dental advisory given  Plan Discussed with: CRNA  Anesthesia Plan Comments:         Anesthesia Quick Evaluation

## 2019-04-23 NOTE — Progress Notes (Signed)
No change to H&P.  Patient continues to desire BTL.  [name], DO

## 2019-04-23 NOTE — Anesthesia Postprocedure Evaluation (Signed)
Anesthesia Post Note  Patient: Lindsey Walker  Procedure(s) Performed: CESAREAN SECTION WITH BILATERAL TUBAL LIGATION (Bilateral )     Patient location during evaluation: PACU Anesthesia Type: Epidural Level of consciousness: oriented and awake and alert Pain management: pain level controlled Vital Signs Assessment: post-procedure vital signs reviewed and stable Respiratory status: spontaneous breathing, respiratory function stable and patient connected to nasal cannula oxygen Cardiovascular status: blood pressure returned to baseline and stable Postop Assessment: no headache, no backache and no apparent nausea or vomiting Anesthetic complications: no    Last Vitals:  Vitals:   04/23/19 1445 04/23/19 1500  BP: (!) 122/51   Pulse: 86 81  Resp: 16 15  Temp:    SpO2: 97% 99%    Last Pain:  Vitals:   04/23/19 1421  TempSrc: Oral   Pain Goal:    LLE Motor Response: No movement due to regional block (04/23/19 1445) LLE Sensation: Tingling (04/23/19 1445) RLE Motor Response: Purposeful movement (04/23/19 1445) RLE Sensation: Tingling (04/23/19 1445)     Epidural/Spinal Function Cutaneous sensation: Tingles (04/23/19 1445), Patient able to flex knees: No (04/23/19 1445), Patient able to lift hips off bed: No (04/23/19 1445), Back pain beyond tenderness at insertion site: No (04/23/19 1445), Progressively worsening motor and/or sensory loss: No (04/23/19 1445), Bowel and/or bladder incontinence post epidural: No (04/23/19 1445)  [place]

## 2019-04-23 NOTE — Anesthesia Procedure Notes (Signed)
Epidural Patient location during procedure: OB Start time: 04/23/2019 12:45 PM End time: 04/23/2019 1:05 PM  Staffing Anesthesiologist: Freddrick March, MD Performed: anesthesiologist   Preanesthetic Checklist Completed: patient identified, pre-op evaluation, timeout performed, IV checked, risks and benefits discussed and monitors and equipment checked  Epidural Patient position: sitting Prep: site prepped and draped and DuraPrep Patient monitoring: continuous pulse ox, blood pressure, heart rate and cardiac monitor Approach: midline Location: L3-L4 Injection technique: LOR air  Needle:  Needle type: Tuohy  Needle gauge: 17 G Needle length: 15 cm Needle insertion depth: 12 cm Catheter type: closed end flexible Catheter size: 19 Gauge Catheter at skin depth: 18 cm Test dose: negative  Assessment Sensory level: T8 Events: blood not aspirated, injection not painful, no injection resistance, negative IV test and no paresthesia  Additional Notes Patient identified. Risks/Benefits/Options discussed with patient including but not limited to bleeding, infection, nerve damage, paralysis, failed block, incomplete pain control, headache, blood pressure changes, nausea, vomiting, reactions to medication both or allergic, itching and postpartum back pain. Confirmed with bedside nurse the patient's most recent platelet count. Confirmed with patient that they are not currently taking any anticoagulation, have any bleeding history or any family history of bleeding disorders. Patient expressed understanding and wished to proceed. All questions were answered. Sterile technique was used throughout the entire procedure. Please see nursing notes for vital signs. Test dose was given through epidural catheter and negative prior to continuing to dose epidural or start infusion. Warning signs of high block given to the patient including shortness of breath, tingling/numbness in hands, complete motor  block, or any concerning symptoms with instructions to call for help. Patient was given instructions on fall risk and not to get out of bed. All questions and concerns addressed with instructions to call with any issues or inadequate analgesia.  Reason for block:procedure for pain

## 2019-04-23 NOTE — Lactation Note (Signed)
This note was copied from a baby's chart. Lactation Consultation Note  Patient Name: Lindsey Walker [id number] Date: 04/23/2019 Reason for consult: Initial assessment;NICU baby;Other (Comment);Term(LGA)  Visited with mom of an 26 hours old LGA NICU female, parents and baby are rooming in couplet care. Mom is a P3 but not experienced with BF, she exclusively pumped and bottle feed with both of her kids. She reported Hx of oversupply with her last baby, the first one for 4 months and the second one for 6 months, but had enough breastmilk store in the freezer to feed till almost 8 months. She has a Spectra pump at home.  LC revised hand expression with mom, noticed that mom has flat nipples and her tissue is semi-compressible. No colostrum noted at this point, mom still on IV fluids. Mom has a DEBP set up in the room but noticed that the junctures were lose, LC adjusted them. She hasn't started pumping yet, but plans to start tonight. Reviewed instructions, cleaning and storage, as well as milk storage guidelines for NICU babies. Baby is showing hunger cues already and sucking on a gloved finger.   Mom doesn't have a Hx of GDM but she believes that she may had it later towards the end of the pregnancy and went undiagnosed. Baby is currently being treated for hypoglycemia through an IV and Similac 24 calorie formula, oral feedings just started during [facility] consultation. Reviewed normal newborn behavior, feeding cues and pumping schedule.  Feeding plan:  1. Encourage mom to pump every 3 hours, at least 8 pumping sessions/24 hours 2. Parents will continue supplementing baby with Similac 24 calorie formula per NICU staff 3. Mom will check with baby's doctor before taking baby to breast, but she'll feed any amount of EBM in addition to Similac 24 calorie formula, baby is on ad lib feedings  BF brochure, BF resources and NICU booklet were reviewed. Parents reported all questions and concerns were answered,  they're both aware of [facility] OP services and will call PRN.   Maternal Data Formula Feeding for Exclusion: Yes Reason for exclusion: Mother's choice to formula and breast feed on admission Has patient been taught Hand Expression?: Yes Does the patient have breastfeeding experience prior to this delivery?: Yes  Feeding Feeding Type: Formula Nipple Type: Slow - flow   Interventions Interventions: Breast feeding basics reviewed;Breast massage;Hand express;DEBP  Lactation Tools Discussed/Used Tools: Pump Breast pump type: Double-Electric Breast Pump WIC Program: No Pump Review: Setup, frequency, and cleaning;Milk Storage Initiated by:: RN and MPeck Date initiated:: 04/23/19   Consult Status Consult Status: Follow-up Date: 04/24/19 Follow-up type: In-patient    Lindsey Walker Lindsey Walker 04/23/2019, 10:13 PM

## 2019-04-23 NOTE — Op Note (Signed)
Lindsey Walker PROCEDURE DATE: 04/23/2019  PREOPERATIVE DIAGNOSIS: Intrauterine pregnancy at  4912w3d weeks gestation, suspected fetal macrosomia, desires sterility  POSTOPERATIVE DIAGNOSIS: The same and breech presentation  PROCEDURE:  Primary Low Transverse Cesarean Section and Bilateral Tubal Ligation  SURGEON:  Dr. Mitchel HonourMegan Lateshia Schmoker  INDICATIONS: Lindsey Walker is a 33 y.o. [id number] at 5212w3d scheduled for cesarean section with BTL secondary to suspected fetal macrosomia and desire for sterility.  The risks of cesarean section discussed with the patient included but were not limited to: bleeding which may require transfusion or reoperation; infection which may require antibiotics; injury to bowel, bladder, ureters or other surrounding organs; injury to the fetus; need for additional procedures including hysterectomy in the event of a life-threatening hemorrhage; placental abnormalities wth subsequent pregnancies, incisional problems, thromboembolic phenomenon and other postoperative/anesthesia complications. Patient is counseled re: risk of failure and regret with bilateral tubal ligation.  The patient concurred with the proposed plan, giving informed written consent for the procedure.    FINDINGS:  Viable female infant in breech presentation, APGARs 8,9: weight pending.  Clear amniotic fluid.  Intact placenta, three vessel cord.  Grossly normal uterus, ovaries and fallopian tubes. .   ANESTHESIA:    Epidural ESTIMATED BLOOD LOSS: 600 ml SPECIMENS: Placenta sent to L&D, bilateral tubal segments sent to pathology COMPLICATIONS: None immediate  PROCEDURE IN DETAIL:  The patient received intravenous antibiotics and had sequential compression devices applied to her lower extremities while in the preoperative area.  She was then taken to the operating room where epidural anesthesia was dosed up to surgical level and was found to be adequate. She was then placed in a dorsal supine position with a  leftward tilt, and prepped and draped in a sterile manner.  A foley catheter was placed into her bladder and attached to constant gravity.  After an adequate timeout was performed, a Pfannenstiel skin incision was made with scalpel and carried through to the underlying layer of fascia. The fascia was incised in the midline and this incision was extended bilaterally using the Mayo scissors. Kocher clamps were applied to the superior aspect of the fascial incision and the underlying rectus muscles were dissected off bluntly. A similar process was carried out on the inferior aspect of the facial incision. The rectus muscles were separated in the midline bluntly and the peritoneum was entered bluntly.  Bladder flap was created sharply and developed bluntly. Bladder was protected behind the bladder blade.  A transverse hysterotomy was made with a scalpel and extended bilaterally bluntly. The bladder blade was then removed. The infant was successfully delivered using typical breech maneuvers, and cord was clamped and cut and infant was handed over to awaiting neonatology team. Uterine massage was then administered and the placenta delivered intact with three-vessel cord. The uterus was cleared of clot and debris.  The hysterotomy was closed with 0 vicryl.  A second imbricating suture of 0-Vicryl was used to reinforce the incision and aid in hemostasis.  Attention was turned to the fallopian tubes.  The right tube was elevated, doubly tied with plain gut and the tubal knuckle was excised with excellent hemostasis.  The contralateral tube was treated in the same fashion.  The uterus was returned to the pelvis.  Copious irrigation as performed.  The peritoneum and rectus muscles were noted to be hemostatic and were reapproximated using 2-0 monocryl in a running fashion.  The fascia was closed with 0-Vicryl in a running fashion with good restoration of anatomy.  The  subcutaneus tissue was copiously irrigated and  reapproximated using 3 interrupted plain gut stitches.  The skin was closed with 4-0 vicryl in a subcuticular fashion.  Pt tolerated the procedure will.  All counts were correct x2.  Pt went to the recovery room in stable condition.

## 2019-04-23 NOTE — Progress Notes (Signed)
Patient set up with DEBP. 

## 2019-04-23 NOTE — Transfer of Care (Signed)
Immediate Anesthesia Transfer of Care Note  Patient: Lindsey Walker  Procedure(s) Performed: CESAREAN SECTION WITH BILATERAL TUBAL LIGATION (Bilateral )  Patient Location: PACU  Anesthesia Type:Epidural  Level of Consciousness: awake, alert  and oriented  Airway & Oxygen Therapy: Patient Spontanous Breathing  Post-op Assessment: Report given to RN and Post -op Vital signs reviewed and stable  Post vital signs: Reviewed and stable  Last Vitals:  Vitals Value Taken Time  BP 110/39 04/23/19 1421  Temp    Pulse 82 04/23/19 1426  Resp 12 04/23/19 1426  SpO2 99 % 04/23/19 1426  Vitals shown include unvalidated device data.  Last Pain:  Vitals:   04/23/19 1014  TempSrc: Oral         Complications: No apparent anesthesia complications

## 2019-04-24 LAB — GLUCOSE, CAPILLARY: Glucose-Capillary: 69 mg/dL — ABNORMAL LOW (ref 70–99)

## 2019-04-24 LAB — CREATININE, SERUM
Creatinine, Ser: 0.62 mg/dL (ref 0.44–1.00)
GFR calc Af Amer: >60 mL/min (ref >60)
GFR calc non Af Amer: >60 mL/min (ref >60)

## 2019-04-24 LAB — CBC
HCT: 28.3 % — ABNORMAL LOW (ref 36.0–46.0)
Hemoglobin: 8.9 g/dL — ABNORMAL LOW (ref 12.0–15.0)
MCH: 23.3 pg — ABNORMAL LOW (ref 26.0–34.0)
MCHC: 31.4 g/dL (ref 30.0–36.0)
MCV: 74.1 fL — ABNORMAL LOW (ref 80.0–100.0)
Platelets: 276 10*3/uL (ref 150–400)
RBC: 3.82 MIL/uL — ABNORMAL LOW (ref 3.87–5.11)
RDW: 19.7 % — ABNORMAL HIGH (ref 11.5–15.5)
WBC: 13.4 10*3/uL — ABNORMAL HIGH (ref 4.0–10.5)
nRBC: 0 % (ref 0.0–0.2)

## 2019-04-24 MED ORDER — LACTATED RINGERS IV BOLUS
300.0000 mL | Freq: Once | INTRAVENOUS | Status: AC
  Administered 2019-04-24: 300 mL via INTRAVENOUS

## 2019-04-24 NOTE — Progress Notes (Signed)
Subjective: Postpartum Day 1: Cesarean Delivery with BTL s/s macrosomi Patient reports tolerating PO.    Objective: Vital signs in last 24 hours: Temp:  [97.7 F (36.5 C)-99.1 F (37.3 C)] 98.3 F (36.8 C) (09/17 0756) Pulse Rate:  [57-112] 74 (09/17 0756) Resp:  [12-20] 16 (09/17 0756) BP: (95-136)/(39-87) 95/70 (09/17 0756) SpO2:  [97 %-100 %] 97 % (09/16 1651) Weight:  [154.7 kg] 154.7 kg (09/16 1014)  Physical Exam:  General: alert, cooperative and appears stated age Lochia: appropriate Uterine Fundus: firm Incision: healing well, no significant drainage, no dehiscence, no significant erythema DVT Evaluation: No evidence of DVT seen on physical exam. Negative Homan's sign. No cords or calf tenderness. No significant calf/ankle edema.  Recent Labs    04/24/19 0812  HGB 8.9*  HCT 28.3*    Assessment/Plan: Status post Cesarean section. Doing well postoperatively.  Continue current care. Baby doing well in NICU.     Tyson Dense 04/24/2019, 9:57 AM

## 2019-04-24 NOTE — Progress Notes (Signed)
Patient screened out for psychosocial assessment since none of the following apply: °Psychosocial stressors documented in mother or baby's chart °Gestation less than 32 weeks °Code at delivery  °Infant with anomalies °Please contact the Clinical Social Worker if specific needs arise, by MOB's request, or if MOB scores greater than 9/yes to question 10 on Edinburgh Postpartum Depression Screen. ° °Margel Joens Boyd-Gilyard, MSW, LCSW °Clinical Social Work °[phone] °  °

## 2019-04-25 LAB — SURGICAL PATHOLOGY

## 2019-04-25 NOTE — Progress Notes (Signed)
Patient doing well  Some burning at incision  BP 138/73 (BP Location: Right Arm)   Pulse 87   Temp 97.7 F (36.5 C) (Oral)   Resp 18   Ht 5\' 7"  (1.702 m)   Wt (!) 154.7 kg   SpO2 100%   Breastfeeding Unknown   BMI 53.41 kg/m  Results for orders placed or performed during the hospital encounter of 04/23/19 (from the past 24 hour(s))  CBC     Status: Abnormal   Collection Time: 04/24/19  8:12 AM  Result Value Ref Range   WBC 13.4 (H) 4.0 - 10.5 K/uL   RBC 3.82 (L) 3.87 - 5.11 MIL/uL   Hemoglobin 8.9 (L) 12.0 - 15.0 g/dL   HCT 28.3 (L) 36.0 - 46.0 %   MCV 74.1 (L) 80.0 - 100.0 fL   MCH 23.3 (L) 26.0 - 34.0 pg   MCHC 31.4 30.0 - 36.0 g/dL   RDW 19.7 (H) 11.5 - 15.5 %   Platelets 276 150 - 400 K/uL   nRBC 0.0 0.0 - 0.2 %  Creatinine, serum     Status: None   Collection Time: 04/24/19  8:12 AM  Result Value Ref Range   Creatinine, Ser 0.62 0.44 - 1.00 mg/dL   GFR calc non Af Amer >60 >60 mL/min   GFR calc Af Amer >60 >60 mL/min   Abdomen is soft and non tender Bandage clean and dry and intact  POD # 2  Doing well Routine care

## 2019-04-26 ENCOUNTER — Encounter (HOSPITAL_COMMUNITY): Payer: Self-pay | Admitting: *Deleted

## 2019-04-26 MED ORDER — INFLUENZA VAC SPLIT QUAD 0.5 ML IM SUSY
0.5000 mL | PREFILLED_SYRINGE | Freq: Once | INTRAMUSCULAR | Status: AC
  Administered 2019-04-26: 0.5 mL via INTRAMUSCULAR
  Filled 2019-04-26: qty 0.5

## 2019-04-26 MED ORDER — INFLUENZA VAC SPLIT QUAD 0.5 ML IM SUSY: 0.5000 mL | PREFILLED_SYRINGE | INTRAMUSCULAR | Status: DC

## 2019-04-26 NOTE — Lactation Note (Signed)
This note was copied from a baby's chart. Lactation Consultation Note  Patient Name: [name] [id number] Date: 04/26/2019   I spoke w/Jaime, RN. Per RN, Mom has been pumping & is getting transitional milk. Mom is comfortable with pumping (Mom noted to have a hx of oversupply with her previous children). RN feels that Mom does not need to be seen by lactation today. Mom is not going home today & it is noted in her chart that she has a Spectra pump at home.   Matthias Hughs [facility] 04/26/2019, 9:05 AM

## 2019-04-26 NOTE — Progress Notes (Signed)
Patient doing well.  Baby progressing in NICU Still some burning at incision site  BP 135/85 (BP Location: Left Arm)   Pulse 79   Temp 97.9 F (36.6 C) (Oral)   Resp 18   Ht 5\' 7"  (1.702 m)   Wt (!) 154.7 kg   SpO2 99%   Breastfeeding Unknown   BMI 53.41 kg/m  Abdomen is soft and non tender Bandage clean and dry   POD # 3  Doing well Routine care Discharge home tomorrow

## 2019-04-27 MED ORDER — OXYCODONE-ACETAMINOPHEN 5-325 MG PO TABS: 1.0000 | ORAL_TABLET | ORAL | 0 refills | Status: AC | PRN

## 2019-04-27 MED ORDER — IBUPROFEN 800 MG PO TABS: 800.0000 mg | ORAL_TABLET | Freq: Four times a day (QID) | ORAL | 0 refills | Status: AC

## 2019-04-27 NOTE — Discharge Summary (Signed)
Obstetric Discharge Summary Reason for Admission: cesarean section Prenatal Procedures: none Intrapartum Procedures: cesarean: low cervical, transverse Postpartum Procedures: none Complications-Operative and Postpartum: none Hemoglobin  Date Value Ref Range Status  04/24/2019 8.9 (L) 12.0 - 15.0 g/dL Final    Comment:    Reticulocyte Hemoglobin testing may be clinically indicated, consider ordering this additional test [id number]    HCT  Date Value Ref Range Status  04/24/2019 28.3 (L) 36.0 - 46.0 % Final    Physical Exam:  General: alert, cooperative and appears stated age 33: appropriate Uterine Fundus: firm Incision: healing well, no significant drainage, no dehiscence, no significant erythema DVT Evaluation: No evidence of DVT seen on physical exam.  Discharge Diagnoses: Term Pregnancy-delivered  Discharge Information: Date: 04/27/2019 Activity: pelvic rest Diet: routine Medications: Ibuprofen and Percocet Condition: improved Instructions: refer to practice specific booklet Discharge to: home   Newborn Data: Live born female  Birth Weight: 11 lb 13.8 oz (5380 g) APGAR: 8, 9  Newborn Delivery   Birth date/time: 04/[birth year]/2019 13:34:00 Delivery type: C-Section, Low Transverse Trial of labor: No C-section categorization: Primary      Home with mother.  Cyril Mourning 04/27/2019, 8:58 AM

## 2021-02-25 ENCOUNTER — Emergency Department (HOSPITAL_BASED_OUTPATIENT_CLINIC_OR_DEPARTMENT_OTHER): Payer: 59

## 2021-02-25 ENCOUNTER — Other Ambulatory Visit: Payer: Self-pay

## 2021-02-25 ENCOUNTER — Encounter (HOSPITAL_BASED_OUTPATIENT_CLINIC_OR_DEPARTMENT_OTHER): Payer: Self-pay | Admitting: Emergency Medicine

## 2021-02-25 ENCOUNTER — Emergency Department (HOSPITAL_BASED_OUTPATIENT_CLINIC_OR_DEPARTMENT_OTHER)
Admission: EM | Admit: 2021-02-25 | Discharge: 2021-02-25 | Disposition: A | Discharge status: Home / Self Care | Payer: 59 | Attending: Emergency Medicine | Admitting: Emergency Medicine

## 2021-02-25 DIAGNOSIS — I10 Essential (primary) hypertension: Secondary | ICD-10-CM | POA: Diagnosis not present

## 2021-02-25 DIAGNOSIS — L03011 Cellulitis of right finger: Secondary | ICD-10-CM | POA: Diagnosis present

## 2021-02-25 DIAGNOSIS — Z87891 Personal history of nicotine dependence: Secondary | ICD-10-CM | POA: Insufficient documentation

## 2021-02-25 DIAGNOSIS — L03113 Cellulitis of right upper limb: Secondary | ICD-10-CM

## 2021-02-25 DIAGNOSIS — D72829 Elevated white blood cell count, unspecified: Secondary | ICD-10-CM | POA: Insufficient documentation

## 2021-02-25 LAB — CBC WITH DIFFERENTIAL/PLATELET
Abs Immature Granulocytes: 0.07 10*3/uL (ref 0.00–0.07)
Basophils Absolute: 0.1 10*3/uL (ref 0.0–0.1)
Basophils Relative: 0 %
Eosinophils Absolute: 0.1 10*3/uL (ref 0.0–0.5)
Eosinophils Relative: 0 %
HCT: 39 % (ref 36.0–46.0)
Hemoglobin: 12.8 g/dL (ref 12.0–15.0)
Immature Granulocytes: 1 %
Lymphocytes Relative: 22 %
Lymphs Abs: 3.3 10*3/uL (ref 0.7–4.0)
MCH: 24.2 pg — ABNORMAL LOW (ref 26.0–34.0)
MCHC: 32.8 g/dL (ref 30.0–36.0)
MCV: 73.7 fL — ABNORMAL LOW (ref 80.0–100.0)
Monocytes Absolute: 0.8 10*3/uL (ref 0.1–1.0)
Monocytes Relative: 6 %
Neutro Abs: 11 10*3/uL — ABNORMAL HIGH (ref 1.7–7.7)
Neutrophils Relative %: 71 %
Platelets: 323 10*3/uL (ref 150–400)
RBC: 5.29 MIL/uL — ABNORMAL HIGH (ref 3.87–5.11)
RDW: 14.9 % (ref 11.5–15.5)
WBC: 15.3 10*3/uL — ABNORMAL HIGH (ref 4.0–10.5)
nRBC: 0 % (ref 0.0–0.2)

## 2021-02-25 LAB — BASIC METABOLIC PANEL
Anion gap: 8 (ref 5–15)
BUN: 13 mg/dL (ref 6–20)
CO2: 24 mmol/L (ref 22–32)
Calcium: 9.2 mg/dL (ref 8.9–10.3)
Chloride: 105 mmol/L (ref 98–111)
Creatinine, Ser: 0.62 mg/dL (ref 0.44–1.00)
GFR, Estimated: >60 mL/min (ref >60)
Glucose, Bld: 104 mg/dL — ABNORMAL HIGH (ref 70–99)
Potassium: 3.7 mmol/L (ref 3.5–5.1)
Sodium: 137 mmol/L (ref 135–145)

## 2021-02-25 LAB — LACTIC ACID, PLASMA: Lactic Acid, Venous: 1 mmol/L (ref 0.5–1.9)

## 2021-02-25 LAB — PREGNANCY, URINE: Preg Test, Ur: NEGATIVE

## 2021-02-25 MED ORDER — PIPERACILLIN-TAZOBACTAM 3.375 G IVPB 30 MIN
3.3750 g | Freq: Once | INTRAVENOUS | Status: AC
  Administered 2021-02-25: 3.375 g via INTRAVENOUS
  Filled 2021-02-25: qty 50

## 2021-02-25 MED ORDER — LIDOCAINE HCL 2 % IJ SOLN
10.0000 mL | Freq: Once | INTRAMUSCULAR | Status: AC
  Administered 2021-02-25: 200 mg
  Filled 2021-02-25: qty 20

## 2021-02-25 MED ORDER — SODIUM CHLORIDE 0.9 % IV BOLUS
1000.0000 mL | Freq: Once | INTRAVENOUS | Status: AC
  Administered 2021-02-25: 1000 mL via INTRAVENOUS

## 2021-02-25 MED ORDER — HYDROCODONE-ACETAMINOPHEN 5-325 MG PO TABS: 1.0000 | ORAL_TABLET | Freq: Four times a day (QID) | ORAL | 0 refills | Status: AC | PRN

## 2021-02-25 MED ORDER — ACETAMINOPHEN 325 MG PO TABS
650.0000 mg | ORAL_TABLET | Freq: Once | ORAL | Status: AC
  Administered 2021-02-25: 650 mg via ORAL
  Filled 2021-02-25: qty 2

## 2021-02-25 MED ORDER — VANCOMYCIN HCL IN DEXTROSE 1-5 GM/200ML-% IV SOLN
1000.0000 mg | Freq: Once | INTRAVENOUS | Status: AC
  Administered 2021-02-25: 1000 mg via INTRAVENOUS
  Filled 2021-02-25: qty 200

## 2021-02-25 MED ORDER — CLINDAMYCIN HCL 300 MG PO CAPS: 300.0000 mg | ORAL_CAPSULE | Freq: Four times a day (QID) | ORAL | 0 refills | Status: AC

## 2021-02-25 NOTE — ED Triage Notes (Signed)
Seen at [facility] yesterday for swelling to right ring finger.  Now having swelling and redness that's running up the arm.

## 2021-02-25 NOTE — ED Provider Notes (Signed)
[facility] EMERGENCY DEPARTMENT Provider Note   CSN: [id number] Arrival date & time: 02/25/21  2014     History Chief Complaint  Patient presents with   Cellulitis    Lindsey Walker is a 35 y.o. female hx of HTN, anemia, here presenting with cellulitis.  Patient states that she had right fourth finger paronychia drained at urgent care yesterday.  She states that she woke up and she started having swelling today and now has streaking up the arm into the axilla.  She has some chills but no fever.  She adamantly denies any IV drug use.  Denies any previous history of bacteremia.   The history is provided by the patient.      Past Medical History:  Diagnosis Date   Anemia    Endometriosis    Headache    History of gestational hypertension    Hypertension    PIH   Pseudotumor cerebri 2016   Vaginal Pap smear, abnormal     Patient Active Problem List   Diagnosis Date Noted   Macrosomia 04/23/2019   S/P cesarean section 04/23/2019   Gestational HTN 01/10/2018   SVD (spontaneous vaginal delivery) 01/10/2018   Idiopathic intracranial hypertension 12/21/2014    Past Surgical History:  Procedure Laterality Date   ABLATION ON ENDOMETRIOSIS  2003   CESAREAN SECTION WITH BILATERAL TUBAL LIGATION Bilateral 04/23/2019   Procedure: CESAREAN SECTION WITH BILATERAL TUBAL LIGATION;  Surgeon: Mitchel Honour, DO;  Location: [facility] LD ORS;  Service: Obstetrics;  Laterality: Bilateral;  PRIMARY  ALLERGY TO SULFA EDC 04/27/19 NEED TRAXI Tracey RNFA   COLPOSCOPY       OB History     Gravida  3   Para  3   Term  3   Preterm      AB      Living  3      SAB      IAB      Ectopic      Multiple  0   Live Births  3           Family History  Adopted: Yes  Problem Relation Age of Onset   Thyroid disease Mother    Parkinson's disease Maternal Grandfather    Thyroid disease Maternal Grandmother    Hypertension Maternal Grandmother    Cancer Maternal  Grandmother        bladder   Atrial fibrillation Maternal Grandmother     Social History   Tobacco Use   Smoking status: Former   Smokeless tobacco: Never  Vaping Use   Vaping Use: Never used  Substance Use Topics   Alcohol use: Yes    Alcohol/week: 0.0 standard drinks    Comment: occasionally throughout year   Drug use: No    Home Medications Prior to Admission medications   Medication Sig Start Date End Date Taking? Authorizing Provider  acetaminophen (TYLENOL) 325 MG tablet Take 2 tablets (650 mg total) by mouth every 6 (six) hours as needed (for pain scale < 4). 01/11/18   Harold Hedge, MD  ibuprofen (ADVIL) 800 MG tablet Take 1 tablet (800 mg total) by mouth every 6 (six) hours. 04/27/19   Marcelle Overlie, MD  oxyCODONE-acetaminophen (PERCOCET/ROXICET) 5-325 MG tablet Take 1-2 tablets by mouth every 4 (four) hours as needed for moderate pain. 04/27/19   Marcelle Overlie, MD  Prenatal Vit-Fe Fumarate-FA (PRENATAL MULTIVITAMIN) TABS tablet Take 1 tablet by mouth daily at 12 noon.    [provider]  Allergies    Sulfa antibiotics  Review of Systems   Review of Systems  Skin:  Positive for wound.  All other systems reviewed and are negative.  Physical Exam Updated Vital Signs BP 124/82   Pulse 87   Temp 99.1 F (37.3 C) (Oral)   Resp 19 Comment: Simultaneous filing. User may not have seen previous data.  Ht 5\' 7"  (1.702 m)   Wt 124.7 kg   LMP 02/05/2021   SpO2 100%   BMI 43.07 kg/m   Physical Exam Vitals and nursing note reviewed.  HENT:     Head: Normocephalic.     Nose: Nose normal.     Mouth/Throat:     Mouth: Mucous membranes are moist.  Eyes:     Extraocular Movements: Extraocular movements intact.     Pupils: Pupils are equal, round, and reactive to light.  Cardiovascular:     Rate and Rhythm: Normal rate and regular rhythm.     Pulses: Normal pulses.     Heart sounds: Normal heart sounds.  Pulmonary:     Effort: Pulmonary effort is  normal.     Breath sounds: Normal breath sounds.  Abdominal:     General: Abdomen is flat.     Palpations: Abdomen is soft.  Musculoskeletal:     Cervical back: Normal range of motion and neck supple.     Comments: Right fourth finger paronychia.  There is no obvious felon.  There is streaking up the hand and arm and antecubital and into the axilla.  Her axillary lymph nodes are not swollen  Skin:    Capillary Refill: Capillary refill takes less than 2 seconds.  Neurological:     General: No focal deficit present.     Mental Status: She is alert and oriented to person, place, and time.  Psychiatric:        Mood and Affect: Mood normal.        Behavior: Behavior normal.       ED Results / Procedures / Treatments   Labs (all labs ordered are listed, but only abnormal results are displayed) Labs Reviewed  CBC WITH DIFFERENTIAL/PLATELET - Abnormal; Notable for the following components:      Result Value   WBC 15.3 (*)    RBC 5.29 (*)    MCV 73.7 (*)    MCH 24.2 (*)    Neutro Abs 11.0 (*)    All other components within normal limits  BASIC METABOLIC PANEL - Abnormal; Notable for the following components:   Glucose, Bld 104 (*)    All other components within normal limits  CULTURE, BLOOD (ROUTINE X 2)  CULTURE, BLOOD (ROUTINE X 2)  LACTIC ACID, PLASMA  PREGNANCY, URINE  LACTIC ACID, PLASMA    EKG None  Radiology DG Hand Complete Right  Result Date: 02/25/2021 CLINICAL DATA:  Right hand cellulitis. Rule out osteo. Right hand redness and swelling, especially ring finger. EXAM: RIGHT HAND - COMPLETE 3+ VIEW COMPARISON:  None. FINDINGS: There is no evidence of fracture or dislocation. No erosion, periosteal reaction, or bone destruction. There is no evidence of arthropathy or other focal bone abnormality. Dorsal soft tissue edema overlies the metacarpals. No soft tissue air or radiopaque foreign body. IMPRESSION: Dorsal soft tissue edema. No osseous abnormality, soft tissue air  or radiopaque foreign body. Electronically Signed   By: 02/27/2021 M.D.   On: 02/25/2021 22:12    Procedures Procedures   INCISION AND DRAINAGE Performed by: 02/27/2021 Consent:  Verbal consent obtained. Risks and benefits: risks, benefits and alternatives were discussed Type: abscess  Body area:R 4th finger   Anesthesia: digital block   Incision was made with a scalpel.  Local anesthetic: lidocaine 2 % no epinephrine  Anesthetic total: 5  ml  Complexity: complex Blunt dissection to break up loculations  Drainage: purulent  Drainage amount: scant   Packing material: none  Patient tolerance: Patient tolerated the procedure well with no immediate complications.    Medications Ordered in ED Medications  vancomycin (VANCOCIN) IVPB 1000 mg/200 mL premix (1,000 mg Intravenous New Bag/Given 02/25/21 2234)  sodium chloride 0.9 % bolus 1,000 mL (1,000 mLs Intravenous New Bag/Given 02/25/21 2220)  piperacillin-tazobactam (ZOSYN) IVPB 3.375 g (0 g Intravenous Stopped 02/25/21 2244)  acetaminophen (TYLENOL) tablet 650 mg (650 mg Oral Given 02/25/21 2218)  lidocaine (XYLOCAINE) 2 % (with pres) injection 200 mg (200 mg Other Given by Other 02/25/21 2231)    ED Course  I have reviewed the triage vital signs and the nursing notes.  Pertinent labs & imaging results that were available during my care of the patient were reviewed by me and considered in my medical decision making (see chart for details).    MDM Rules/Calculators/A&P                          SWAYZEE WADLEY is a 35 y.o. female here presenting with right fourth finger paronychia and now has streaking up the arm.  Concerned that she may be septic from it.  She is tachycardic and has low-grade temperature.  Will get CBC and CMP and lactate and cultures.  Will give broad-spectrum antibiotics.  11:21 PM Repeated an I&D and mostly serosanguineous drainage. Patient's white blood cell count is elevated at 15.  Her  lactate is normal.  Blood cultures are pending.  Patient was given Vanco and Zosyn.  I think she needs to return for wound check in 48 hours.  Told her to soak finger in warm water.   Final Clinical Impression(s) / ED Diagnoses Final diagnoses:  None    Rx / DC Orders ED Discharge Orders     None        Charlynne Pander, MD 02/25/21 2322

## 2021-02-25 NOTE — Discharge Instructions (Addendum)
You have a paronychia that I tried to drain.  Expect some drainage from that site so please change the dressing if it gets soaked.  You can also soak your finger in some warm water and that will help with drainage  You need to take clindamycin 4 times daily for a week.  Take Tylenol for pain and Norco for severe pain.  Your wound needs to be checked by your doctor or urgent care in 48 hours  Return to ER if you have worse pain or redness or fevers.

## 2021-02-25 NOTE — ED Notes (Signed)
EDP at bedside  

## 2021-03-03 LAB — CULTURE, BLOOD (ROUTINE X 2)
Culture: NO GROWTH
Culture: NO GROWTH
Special Requests: ADEQUATE
Special Requests: ADEQUATE

## 2021-12-29 IMAGING — DX DG HAND COMPLETE 3+V*R*
3 series · 3 of 3 positions shown · non-contrast
Comparison: None.

CLINICAL DATA: Right hand cellulitis. Rule out osteo. Right hand
redness and swelling, especially ring finger.

EXAM:
RIGHT HAND - COMPLETE 3+ VIEW

[hand ap]
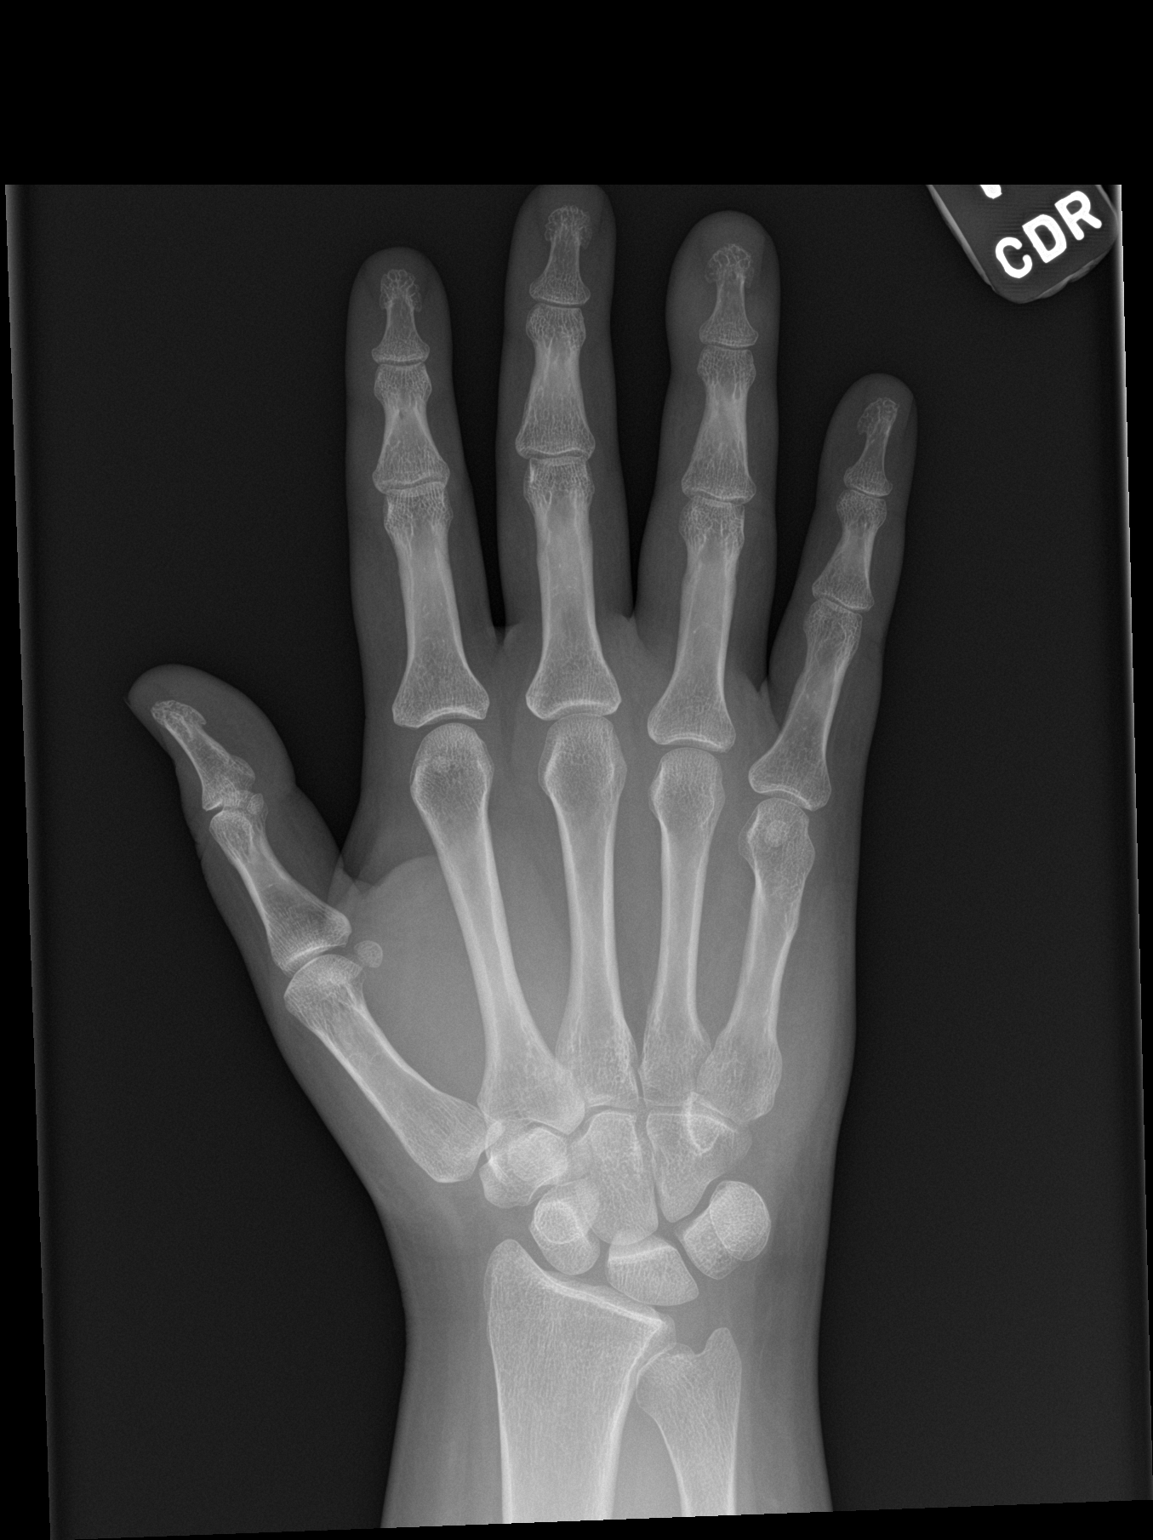

[hand obl]
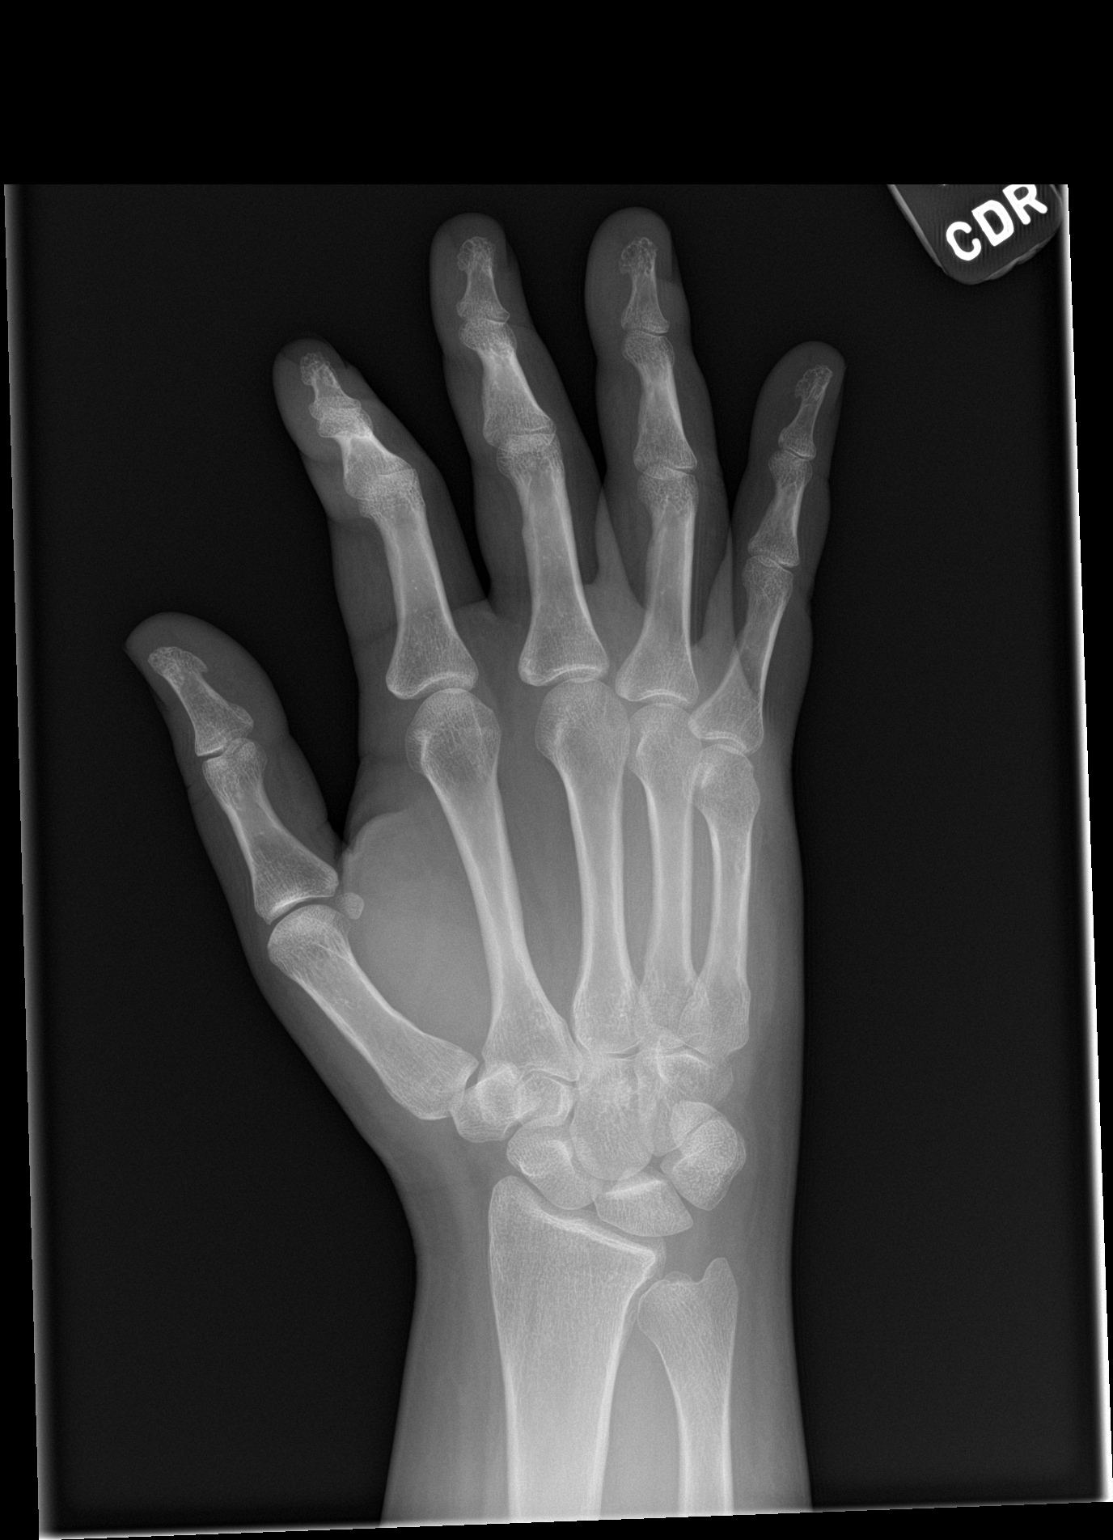

[hand lat]
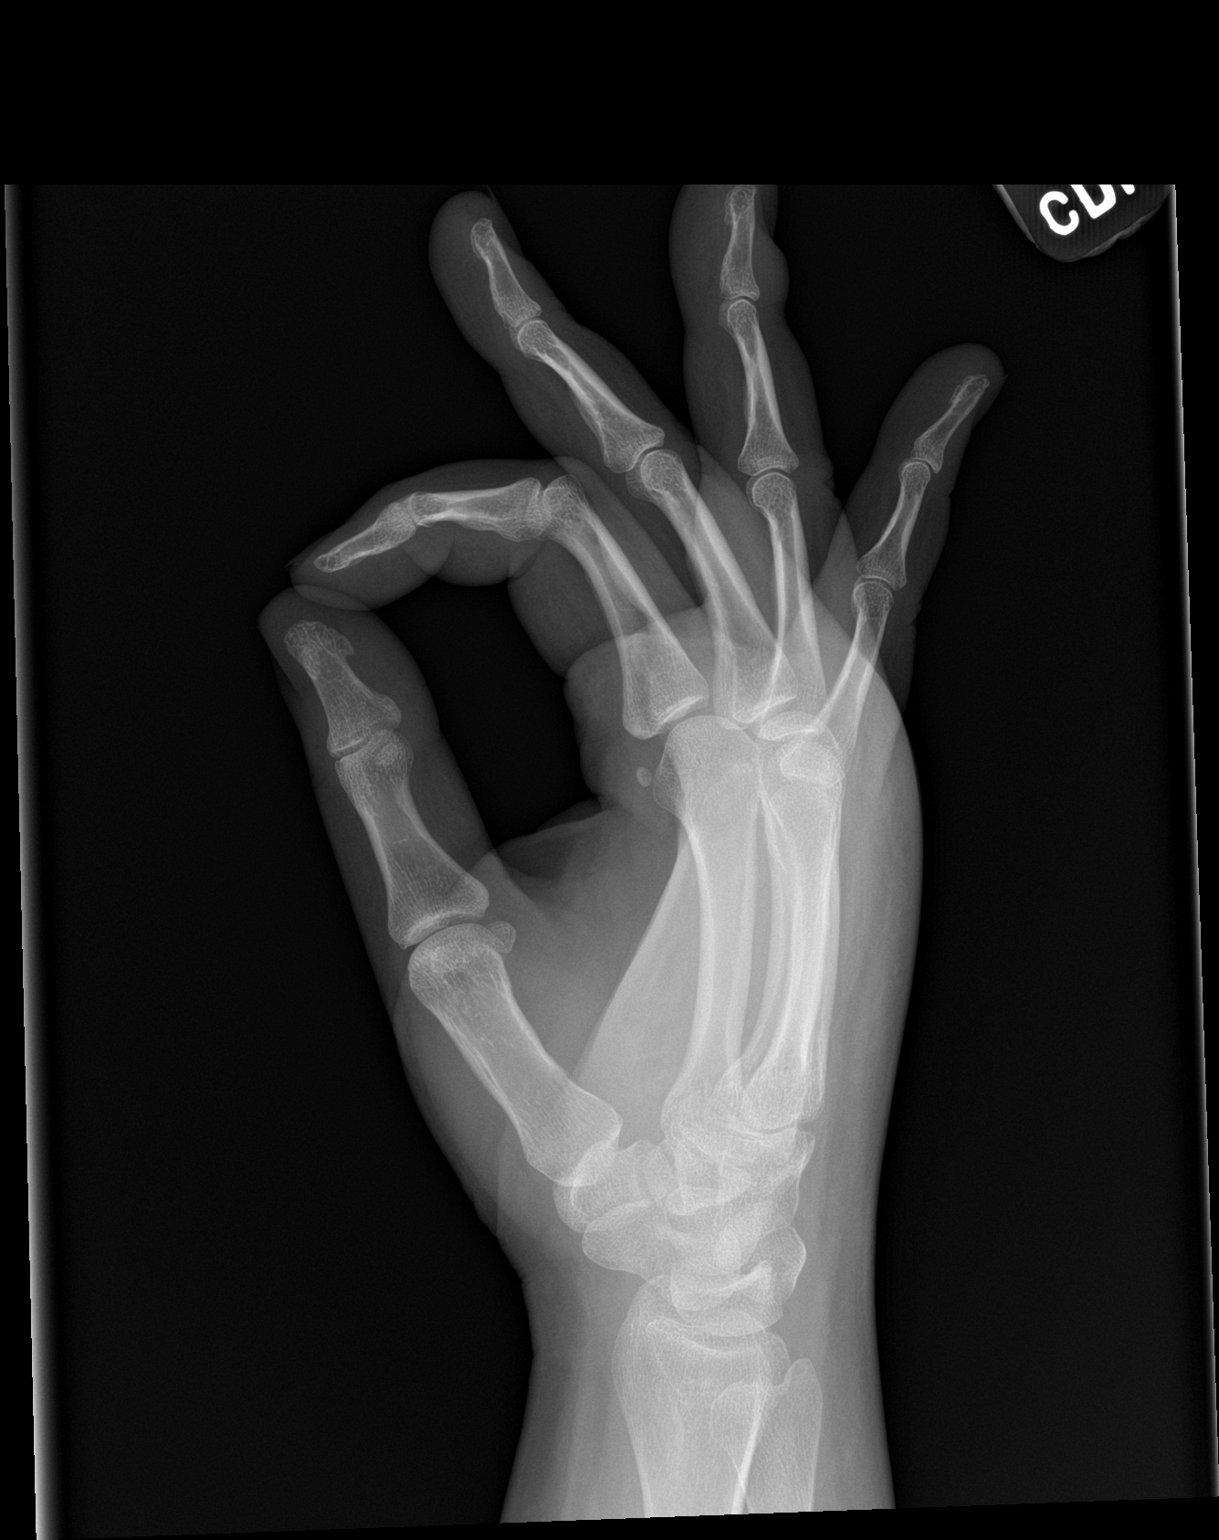

[3 of 3 positions shown; findings below may reference images not displayed]

FINDINGS: There is no evidence of fracture or dislocation. No erosion,
periosteal reaction, or bone destruction. There is no evidence of
arthropathy or other focal bone abnormality. Dorsal soft tissue
edema overlies the metacarpals. No soft tissue air or radiopaque
foreign body.
IMPRESSION: Dorsal soft tissue edema. No osseous abnormality, soft tissue air or
radiopaque foreign body.

## 2023-07-22 ENCOUNTER — Encounter (HOSPITAL_BASED_OUTPATIENT_CLINIC_OR_DEPARTMENT_OTHER): Payer: Self-pay | Admitting: Emergency Medicine

## 2023-07-22 ENCOUNTER — Emergency Department (HOSPITAL_BASED_OUTPATIENT_CLINIC_OR_DEPARTMENT_OTHER)
Admission: EM | Admit: 2023-07-22 | Discharge: 2023-07-22 | Disposition: A | Discharge status: Home / Self Care | Payer: 59

## 2023-07-22 ENCOUNTER — Emergency Department (HOSPITAL_BASED_OUTPATIENT_CLINIC_OR_DEPARTMENT_OTHER): Payer: 59

## 2023-07-22 ENCOUNTER — Other Ambulatory Visit: Payer: Self-pay

## 2023-07-22 DIAGNOSIS — D72829 Elevated white blood cell count, unspecified: Secondary | ICD-10-CM | POA: Insufficient documentation

## 2023-07-22 DIAGNOSIS — L03317 Cellulitis of buttock: Secondary | ICD-10-CM | POA: Insufficient documentation

## 2023-07-22 DIAGNOSIS — M7918 Myalgia, other site: Secondary | ICD-10-CM | POA: Diagnosis present

## 2023-07-22 DIAGNOSIS — I10 Essential (primary) hypertension: Secondary | ICD-10-CM | POA: Diagnosis not present

## 2023-07-22 LAB — CBC WITH DIFFERENTIAL/PLATELET
Abs Immature Granulocytes: 0.07 10*3/uL (ref 0.00–0.07)
Basophils Absolute: 0 10*3/uL (ref 0.0–0.1)
Basophils Relative: 0 %
Eosinophils Absolute: 0 10*3/uL (ref 0.0–0.5)
Eosinophils Relative: 0 %
HCT: 39.3 % (ref 36.0–46.0)
Hemoglobin: 12.6 g/dL (ref 12.0–15.0)
Immature Granulocytes: 0 %
Lymphocytes Relative: 10 %
Lymphs Abs: 1.6 10*3/uL (ref 0.7–4.0)
MCH: 24.6 pg — ABNORMAL LOW (ref 26.0–34.0)
MCHC: 32.1 g/dL (ref 30.0–36.0)
MCV: 76.6 fL — ABNORMAL LOW (ref 80.0–100.0)
Monocytes Absolute: 0.9 10*3/uL (ref 0.1–1.0)
Monocytes Relative: 5 %
Neutro Abs: 13.8 10*3/uL — ABNORMAL HIGH (ref 1.7–7.7)
Neutrophils Relative %: 85 %
Platelets: 257 10*3/uL (ref 150–400)
RBC: 5.13 MIL/uL — ABNORMAL HIGH (ref 3.87–5.11)
RDW: 13.8 % (ref 11.5–15.5)
WBC: 16.4 10*3/uL — ABNORMAL HIGH (ref 4.0–10.5)
nRBC: 0 % (ref 0.0–0.2)

## 2023-07-22 LAB — LACTIC ACID, PLASMA
Lactic Acid, Venous: 2.1 mmol/L (ref 0.5–1.9)
Lactic Acid, Venous: 1.3 mmol/L (ref 0.5–1.9)

## 2023-07-22 LAB — BASIC METABOLIC PANEL
Anion gap: 9 (ref 5–15)
BUN: 11 mg/dL (ref 6–20)
CO2: 23 mmol/L (ref 22–32)
Calcium: 8.6 mg/dL — ABNORMAL LOW (ref 8.9–10.3)
Chloride: 105 mmol/L (ref 98–111)
Creatinine, Ser: 0.79 mg/dL (ref 0.44–1.00)
GFR, Estimated: >60 mL/min (ref >60)
Glucose, Bld: 105 mg/dL — ABNORMAL HIGH (ref 70–99)
Potassium: 3.2 mmol/L — ABNORMAL LOW (ref 3.5–5.1)
Sodium: 137 mmol/L (ref 135–145)

## 2023-07-22 MED ORDER — DOXYCYCLINE HYCLATE 100 MG PO CAPS: 100.0000 mg | ORAL_CAPSULE | Freq: Two times a day (BID) | ORAL | 0 refills | Status: AC

## 2023-07-22 MED ORDER — KETOROLAC TROMETHAMINE 15 MG/ML IJ SOLN
15.0000 mg | Freq: Once | INTRAMUSCULAR | Status: AC
  Administered 2023-07-22: 15 mg via INTRAVENOUS
  Filled 2023-07-22: qty 1

## 2023-07-22 MED ORDER — KETOROLAC TROMETHAMINE 10 MG PO TABS: 10.0000 mg | ORAL_TABLET | Freq: Four times a day (QID) | ORAL | 0 refills | Status: AC | PRN

## 2023-07-22 MED ORDER — DOXYCYCLINE HYCLATE 100 MG PO TABS
100.0000 mg | ORAL_TABLET | Freq: Once | ORAL | Status: AC
  Administered 2023-07-22: 100 mg via ORAL
  Filled 2023-07-22: qty 1

## 2023-07-22 NOTE — ED Provider Notes (Signed)
[place] EMERGENCY DEPARTMENT AT [facility] Provider Note   CSN: [id number] Arrival date & time: 07/22/23  4098     History  Chief Complaint  Patient presents with   Recurrent Skin Infections    Lindsey Walker is a 37 y.o. female.  37 year old female with past medical history of hypertension and no history of diabetes presenting to the emergency department today with pain over her left buttock.  The patient states this started yesterday evening and worsened this morning.  She denies any fevers.  She denies a history of IV drug abuse.  She states that the pain is worse when she sits on it.  She came to the ER today due to these ongoing symptoms.  She denies any exacerbating alleviating factors other than sitting on the area.  She denies any history of recurrent skin infections in the past.        Home Medications Prior to Admission medications   Medication Sig Start Date End Date Taking? Authorizing Provider  doxycycline (VIBRAMYCIN) 100 MG capsule Take 1 capsule (100 mg total) by mouth 2 (two) times daily. 07/22/23  Yes Durwin Glaze, MD  ketorolac (TORADOL) 10 MG tablet Take 1 tablet (10 mg total) by mouth every 6 (six) hours as needed. 07/22/23  Yes Durwin Glaze, MD  acetaminophen (TYLENOL) 325 MG tablet Take 2 tablets (650 mg total) by mouth every 6 (six) hours as needed (for pain scale < 4). 01/11/18   Harold Hedge, MD  clindamycin (CLEOCIN) 300 MG capsule Take 1 capsule (300 mg total) by mouth 4 (four) times daily. X 7 days 02/25/21   Charlynne Pander, MD  HYDROcodone-acetaminophen (NORCO/VICODIN) 5-325 MG tablet Take 1 tablet by mouth every 6 (six) hours as needed. 02/25/21   Charlynne Pander, MD  ibuprofen (ADVIL) 800 MG tablet Take 1 tablet (800 mg total) by mouth every 6 (six) hours. 04/27/19   Marcelle Overlie, MD  oxyCODONE-acetaminophen (PERCOCET/ROXICET) 5-325 MG tablet Take 1-2 tablets by mouth every 4 (four) hours as needed for moderate pain.  04/27/19   Marcelle Overlie, MD  Prenatal Vit-Fe Fumarate-FA (PRENATAL MULTIVITAMIN) TABS tablet Take 1 tablet by mouth daily at 12 noon.    [provider]      Allergies    Sulfa antibiotics    Review of Systems   Review of Systems  Skin:  Positive for color change and rash.  All other systems reviewed and are negative.   Physical Exam Updated Vital Signs BP (!) 107/90 (BP Location: Right Arm)   Pulse 89   Temp 98.6 F (37 C) (Oral)   Resp 18   Wt 107.5 kg   SpO2 97%   BMI 37.12 kg/m  Physical Exam Vitals and nursing note reviewed.   Gen: NAD Eyes: PERRL, EOMI HEENT: no oropharyngeal swelling Neck: trachea midline Resp: clear to auscultation bilaterally Card: RRR, no murmurs, rubs, or gallops Abd: nontender, nondistended Extremities: no calf tenderness, no edema Vascular: 2+ radial pulses bilaterally, 2+ DP pulses bilaterally Skin: Chaperoned by female nursing staff shows an indurated area over the left buttock that is erythematous.  There is no crepitus.  This area is very well-defined and measures an area roughly of 10 cm x 6 cm.  There is no fluctuance. Psyc: acting appropriately   ED Results / Procedures / Treatments   Labs (all labs ordered are listed, but only abnormal results are displayed) Labs Reviewed  CBC WITH DIFFERENTIAL/PLATELET - Abnormal; Notable for the following  components:      Result Value   WBC 16.4 (*)    RBC 5.13 (*)    MCV 76.6 (*)    MCH 24.6 (*)    Neutro Abs 13.8 (*)    All other components within normal limits  BASIC METABOLIC PANEL - Abnormal; Notable for the following components:   Potassium 3.2 (*)    Glucose, Bld 105 (*)    Calcium 8.6 (*)    All other components within normal limits  LACTIC ACID, PLASMA - Abnormal; Notable for the following components:   Lactic Acid, Venous 2.1 (*)    All other components within normal limits  LACTIC ACID, PLASMA    EKG None  Radiology [place] LT LOWER EXTREM LTD SOFT TISSUE NON  VASCULAR Result Date: 07/22/2023 CLINICAL DATA:  Left upper thigh pain, swelling, and erythema. Leukocytosis. EXAM: ULTRASOUND LEFT LOWER EXTREMITY LIMITED TECHNIQUE: Ultrasound examination of the lower left buttock and proximal left thigh soft tissues was performed in the area of clinical concern. COMPARISON:  None Available. FINDINGS: Edema and hyperemia is seen in the subcutaneous tissues of the lower left buttock and upper posterior thigh, however, no soft tissue mass or abscess identified. IMPRESSION: Subcutaneous soft tissue edema and hyperemia in area of clinical concern, suspicious for cellulitis. No abscess or mass identified. Electronically Signed   By: Danae Orleans M.D.   On: 07/22/2023 10:53    Procedures Procedures    Medications Ordered in ED Medications  doxycycline (VIBRA-TABS) tablet 100 mg (has no administration in time range)  ketorolac (TORADOL) 15 MG/ML injection 15 mg (15 mg Intravenous Given 07/22/23 0911)    ED Course/ Medical Decision Making/ A&P                                 Medical Decision Making 37 year old female with past medical history of hypertension presenting to the emergency department today with pain noted to the left buttock started yesterday.  This seems more consistent with cellulitis.  Will obtain soft tissue US to eval for abscess.  The patient is persistently tachycardic here which may be due to pain.  I will obtain basic labs Wels a lactic acid to screen for sepsis.  If her lactic acid is elevated we will consider additional imaging but if not and if the patient's heart rate improves here with favorable lab workup I think that we can treat her with oral antibiotics.  The patient does have a leukocytosis here her lactate is very mildly elevated.  Her vital signs resolved without any intervention here in oral fluids.  Ultrasound does not show any abscess.  The patient is given doxycycline.  She is discharged with return precautions.  Amount and/or  Complexity of Data Reviewed Labs: ordered. Radiology: ordered.  Risk Prescription drug management.           Final Clinical Impression(s) / ED Diagnoses Final diagnoses:  Cellulitis of buttock    Rx / DC Orders ED Discharge Orders          Ordered    ketorolac (TORADOL) 10 MG tablet  Every 6 hours PRN        07/22/23 1202    doxycycline (VIBRAMYCIN) 100 MG capsule  2 times daily        07/22/23 1202              Durwin Glaze, MD 07/22/23 1203

## 2023-07-22 NOTE — ED Triage Notes (Signed)
Left posterior upper thigh possible cellulitis , reports pain , redness and swelling to area . No drainage , no fever . No Hx DVT . Denies shortness of breath or chest pain .

## 2023-07-22 NOTE — Discharge Instructions (Signed)
Your ultrasound does not show any fluid collection.  Please take the antibiotic as prescribed for cellulitis.  Take the Toradol as needed for pain and inflammation.  Follow-up with your doctor for reevaluation.  Please return to the emergency department for worsening symptoms.
